# Patient Record
Sex: Male | Born: 1955 | Race: White | Hispanic: No | Marital: Married | Smoking: Never smoker
Health system: Southern US, Community
[De-identification: ages and names within clinical notes are randomized; demographics above are authoritative.]

## PROBLEM LIST (undated history)

## (undated) DIAGNOSIS — M109 Gout, unspecified: Secondary | ICD-10-CM

## (undated) DIAGNOSIS — H109 Unspecified conjunctivitis: Secondary | ICD-10-CM

## (undated) DIAGNOSIS — L309 Dermatitis, unspecified: Secondary | ICD-10-CM

## (undated) DIAGNOSIS — R55 Syncope and collapse: Secondary | ICD-10-CM

## (undated) DIAGNOSIS — M2041 Other hammer toe(s) (acquired), right foot: Secondary | ICD-10-CM

## (undated) DIAGNOSIS — R9439 Abnormal result of other cardiovascular function study: Secondary | ICD-10-CM

## (undated) DIAGNOSIS — M129 Arthropathy, unspecified: Secondary | ICD-10-CM

## (undated) DIAGNOSIS — G459 Transient cerebral ischemic attack, unspecified: Secondary | ICD-10-CM

## (undated) DIAGNOSIS — F419 Anxiety disorder, unspecified: Secondary | ICD-10-CM

## (undated) DIAGNOSIS — Z952 Presence of prosthetic heart valve: Secondary | ICD-10-CM

## (undated) DIAGNOSIS — R7989 Other specified abnormal findings of blood chemistry: Secondary | ICD-10-CM

## (undated) DIAGNOSIS — L0291 Cutaneous abscess, unspecified: Secondary | ICD-10-CM

## (undated) DIAGNOSIS — I517 Cardiomegaly: Secondary | ICD-10-CM

## (undated) DIAGNOSIS — L509 Urticaria, unspecified: Secondary | ICD-10-CM

## (undated) DIAGNOSIS — S46019A Strain of muscle(s) and tendon(s) of the rotator cuff of unspecified shoulder, initial encounter: Secondary | ICD-10-CM

## (undated) DIAGNOSIS — E669 Obesity, unspecified: Secondary | ICD-10-CM

## (undated) DIAGNOSIS — M719 Bursopathy, unspecified: Secondary | ICD-10-CM

## (undated) DIAGNOSIS — J029 Acute pharyngitis, unspecified: Secondary | ICD-10-CM

## (undated) DIAGNOSIS — G4700 Insomnia, unspecified: Secondary | ICD-10-CM

## (undated) DIAGNOSIS — M7750 Other enthesopathy of unspecified foot: Secondary | ICD-10-CM

## (undated) DIAGNOSIS — I10 Essential (primary) hypertension: Secondary | ICD-10-CM

## (undated) DIAGNOSIS — M25579 Pain in unspecified ankle and joints of unspecified foot: Secondary | ICD-10-CM

## (undated) DIAGNOSIS — I951 Orthostatic hypotension: Secondary | ICD-10-CM

## (undated) DIAGNOSIS — K224 Dyskinesia of esophagus: Secondary | ICD-10-CM

## (undated) DIAGNOSIS — I350 Nonrheumatic aortic (valve) stenosis: Secondary | ICD-10-CM

## (undated) DIAGNOSIS — L0390 Cellulitis, unspecified: Secondary | ICD-10-CM

## (undated) DIAGNOSIS — S8010XA Contusion of unspecified lower leg, initial encounter: Secondary | ICD-10-CM

## (undated) DIAGNOSIS — M5412 Radiculopathy, cervical region: Secondary | ICD-10-CM

## (undated) DIAGNOSIS — I4891 Unspecified atrial fibrillation: Secondary | ICD-10-CM

## (undated) DIAGNOSIS — E785 Hyperlipidemia, unspecified: Secondary | ICD-10-CM

## (undated) DIAGNOSIS — R609 Edema, unspecified: Secondary | ICD-10-CM

## (undated) DIAGNOSIS — E7800 Pure hypercholesterolemia, unspecified: Secondary | ICD-10-CM

## (undated) DIAGNOSIS — J069 Acute upper respiratory infection, unspecified: Secondary | ICD-10-CM

## (undated) DIAGNOSIS — T7840XA Allergy, unspecified, initial encounter: Secondary | ICD-10-CM

## (undated) HISTORY — DX: Gout, unspecified: M10.9

## (undated) HISTORY — DX: Strain of muscle(s) and tendon(s) of the rotator cuff of unspecified shoulder, initial encounter: S46.019A

## (undated) HISTORY — DX: Radiculopathy, cervical region: M54.12

## (undated) HISTORY — DX: Unspecified atrial fibrillation: I48.91

## (undated) HISTORY — DX: Nonrheumatic aortic (valve) stenosis: I35.0

## (undated) HISTORY — DX: Anxiety disorder, unspecified: F41.9

## (undated) HISTORY — DX: Other hammer toe(s) (acquired), right foot: M20.41

## (undated) HISTORY — DX: Urticaria, unspecified: L50.9

## (undated) HISTORY — DX: Acute upper respiratory infection, unspecified: J06.9

## (undated) HISTORY — DX: Cutaneous abscess, unspecified: L02.91

## (undated) HISTORY — DX: Dyskinesia of esophagus: K22.4

## (undated) HISTORY — DX: Acute pharyngitis, unspecified: J02.9

## (undated) HISTORY — DX: Essential (primary) hypertension: I10

## (undated) HISTORY — DX: Unspecified conjunctivitis: H10.9

## (undated) HISTORY — DX: Transient cerebral ischemic attack, unspecified: G45.9

## (undated) HISTORY — DX: Other specified abnormal findings of blood chemistry: R79.89

## (undated) HISTORY — DX: Insomnia, unspecified: G47.00

## (undated) HISTORY — PX: UVULECTOMY: SHX2631

## (undated) HISTORY — PX: TONSILLECTOMY: SUR1361

## (undated) HISTORY — DX: Abnormal result of other cardiovascular function study: R94.39

## (undated) HISTORY — DX: Arthropathy, unspecified: M12.9

## (undated) HISTORY — DX: Allergy, unspecified, initial encounter: T78.40XA

## (undated) HISTORY — DX: Obesity, unspecified: E66.9

## (undated) HISTORY — DX: Cellulitis, unspecified: L03.90

## (undated) HISTORY — DX: Bursopathy, unspecified: M71.9

## (undated) HISTORY — DX: Hyperlipidemia, unspecified: E78.5

## (undated) HISTORY — DX: Orthostatic hypotension: I95.1

## (undated) HISTORY — DX: Dermatitis, unspecified: L30.9

## (undated) HISTORY — DX: Pain in unspecified ankle and joints of unspecified foot: M25.579

## (undated) HISTORY — DX: Pure hypercholesterolemia, unspecified: E78.00

## (undated) HISTORY — DX: Presence of prosthetic heart valve: Z95.2

## (undated) HISTORY — DX: Contusion of unspecified lower leg, initial encounter: S80.10XA

## (undated) HISTORY — DX: Edema, unspecified: R60.9

## (undated) HISTORY — DX: Cardiomegaly: I51.7

## (undated) HISTORY — DX: Syncope and collapse: R55

## (undated) HISTORY — DX: Other enthesopathy of unspecified foot and ankle: M77.50

---

## 1994-06-24 HISTORY — PX: NASAL SEPTUM SURGERY: SHX37

## 2012-12-22 DIAGNOSIS — Z8673 Personal history of transient ischemic attack (TIA), and cerebral infarction without residual deficits: Secondary | ICD-10-CM

## 2012-12-22 HISTORY — DX: Personal history of transient ischemic attack (TIA), and cerebral infarction without residual deficits: Z86.73

## 2013-01-14 DIAGNOSIS — G459 Transient cerebral ischemic attack, unspecified: Secondary | ICD-10-CM | POA: Insufficient documentation

## 2013-06-24 HISTORY — PX: HAMMER TOE SURGERY: SHX385

## 2013-06-24 HISTORY — PX: METATARSAL OSTEOTOMY: SHX1641

## 2013-08-31 DIAGNOSIS — M2040 Other hammer toe(s) (acquired), unspecified foot: Secondary | ICD-10-CM | POA: Insufficient documentation

## 2014-06-24 HISTORY — PX: AORTIC VALVE REPLACEMENT: SHX41

## 2015-02-13 HISTORY — PX: RIGHT AND LEFT HEART CATH: CATH118262

## 2015-06-20 HISTORY — PX: CARDIOVERSION: SHX1299

## 2016-12-18 DIAGNOSIS — M1991 Primary osteoarthritis, unspecified site: Secondary | ICD-10-CM | POA: Insufficient documentation

## 2018-06-24 HISTORY — PX: MEDIAL PARTIAL KNEE REPLACEMENT: SHX5965

## 2019-05-05 LAB — HM COLONOSCOPY

## 2020-07-05 ENCOUNTER — Telehealth: Payer: Self-pay

## 2020-07-05 NOTE — Telephone Encounter (Signed)
[facility] ON FILE [facility] [phone] SENT REFERRAL TO SCHEDULING

## 2020-07-20 NOTE — Progress Notes (Signed)
CARDIOLOGY CONSULT NOTE       Patient ID: Cameron Carey MRN: [id number] DOB/AGE: 1955-11-21 65 y.o.  Admit date: (Not on file) Referring Physician: Carole Civil DO Primary Physician: Patient, No Pcp Per Primary Cardiologist: New Reason for Consultation: PAF   Active Problems:   * No active hospital problems. *   HPI:  65 y.o. 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  Office visit 06/13/20 noted to be in asymptomatic afib. Started on eliquis 5 bid ECG reviewed from 06/13/20 afib rate 97 voltage for LVH otherwise normal   He had a bioprosthetic AVR [place] in [place] 2018 No CAD. Had post op afib with 3 cardioversions Was not on anticoagulation For more than a couple months   He drinks beer/bourbon 1/weeknight and 3-4 on weekend nights. Discussed cutting back  He has noted since December and onset of afib that he has more LE edema and exertional dyspnea. 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 Abscess   . Allergy   . Anxiety   . Aortic valve stenosis   . Arthropathy   . Atrial fibrillation (HCC)   . Brachial radiculitis   . Bursitis   . Cellulitis   . Conjunctivitis   . Contusion of lower leg   . Dermatitis   . Edema   . Enthesopathy of ankle   . Esophageal dysmotility   . Gout   . History of heart valve  replacement   . HLD (hyperlipidemia)   . HTN (hypertension)   . Hypercholesterolemia   . Insomnia   . Joint pain of ankle and foot, unspecified laterality   . Left ventricular hypertrophy   . Obesity (BMI 30-39.9)   . Orthostatic hypotension   . Pharyngitis   . Rotator cuff strain    inflammation  . Serum creatinine raised   . Syncope and collapse   . Transient cerebral ischemia   . Upper respiratory infection   . Wheal     History reviewed. No pertinent family history.  Social History   Socioeconomic History  . Marital status: Married    Spouse name: Not on file  . Number of children: Not on file  . Years of education: Not on file  . Highest education level: Not on file  Occupational History  . Not on file  Tobacco Use  . Smoking status: Never Smoker  . Smokeless tobacco: Never Used  Substance and Sexual Activity  . Alcohol use: Never  . Drug use: Never  . Sexual activity: Not on file  Other Topics Concern  . Not on file  Social History Narrative  . Not on file   Social Determinants of Health   Financial Resource Strain: Not on file  Food Insecurity: Not  on file  Transportation Needs: Not on file  Physical Activity: Not on file  Stress: Not on file  Social Connections: Not on file  Intimate Partner Violence: Not on file    Past Surgical History:  Procedure Laterality Date  . NO PAST SURGERIES        Current Outpatient Medications:  .  allopurinol (ZYLOPRIM) 100 MG tablet, Take 100 mg by mouth daily., Disp: , Rfl:  .  amLODipine (NORVASC) 5 MG tablet, Take 5 mg by mouth daily., Disp: , Rfl:  .  aspirin 81 MG EC tablet, Take 162 mg by mouth daily., Disp: , Rfl:  .  atorvastatin (LIPITOR) 20 MG tablet, Take 20 mg by mouth daily., Disp: , Rfl:  .  clonazePAM (KLONOPIN) 0.5 MG tablet, Take 0.5 mg by mouth 2 (two) times daily as needed for anxiety., Disp: , Rfl:  .  fluocinolone (VANOS) 0.01 % cream, Apply topically 2 (two) times daily., Disp: , Rfl:  .   hydrochlorothiazide (HYDRODIURIL) 25 MG tablet, Take 25 mg by mouth daily., Disp: , Rfl:  .  HYDROXYZINE HCL PO, Take 25 mg by mouth., Disp: , Rfl:  .  metoprolol succinate (TOPROL-XL) 100 MG 24 hr tablet, Take 75 mg by mouth daily., Disp: , Rfl:  .  olmesartan (BENICAR) 5 MG tablet, Take by mouth daily., Disp: , Rfl:  .  tadalafil (CIALIS) 20 MG tablet, Take 20 mg by mouth daily as needed for erectile dysfunction., Disp: , Rfl:     Physical Exam: Blood pressure 126/86, pulse 72, height 6\' 2"  (1.88 m), weight 127.5 kg, SpO2 97 %.   Affect appropriate Healthy:  appears stated age HEENT: normal Neck supple with no adenopathy JVP normal no bruits no thyromegaly Lungs clear with no wheezing and good diaphragmatic motion Heart:  S1/S2 no murmur, no rub, gallop or click PMI normal Abdomen: benighn, BS positve, no tenderness, no AAA no bruit.  No HSM or HJR Distal pulses intact with no bruits No edema Neuro non-focal Skin warm and dry No muscular weakness   Labs:  No results found for: WBC, HGB, HCT, MCV, PLT No results for input(s): NA, K, CL, CO2, BUN, CREATININE, CALCIUM, PROT, BILITOT, ALKPHOS, ALT, AST, GLUCOSE in the last 168 hours.  Invalid input(s): LABALBU No results found for: CKTOTAL, CKMB, CKMBINDEX, TROPONINI No results found for: CHOL No results found for: HDL No results found for: LDLCALC No results found for: TRIG No results found for: CHOLHDL No results found for: LDLDIRECT    Radiology: No results found.  EKG: afib rate 68 bpm normal ST segments    ASSESSMENT AND PLAN:   1. PAF:  Start xarelto  F/U TTE assess EF and atrial sizes. Continue beta blocker rates are fine Risk of DCC including stroke , TMP intubation discussed wants to proceed once on anticoagulation for 3 weeks  2. HTN:  Stop norvasc likely contributing to edema  3. HLD:  At goal continue statin 4. Insomnia: continue PRN xanax  5. AVR:  No AR on exam 2018 bioprosthetic will try to get op note  from [place] see above regarding TTE   F/u in 3-4 weeks to arrange [facility] TTE D/c Norvasc D/c ASA Start Xarelto   Signed: [facility] 07/25/2020, 4:31 PM

## 2020-07-25 ENCOUNTER — Encounter: Payer: Self-pay | Admitting: Cardiovascular Disease

## 2020-07-25 ENCOUNTER — Ambulatory Visit: Payer: 59 | Admitting: Cardiovascular Disease

## 2020-07-25 ENCOUNTER — Other Ambulatory Visit: Payer: Self-pay

## 2020-07-25 VITALS — BP 126/86 | HR 72 | Ht 74.0 in | Wt 281.0 lb

## 2020-07-25 DIAGNOSIS — I10 Essential (primary) hypertension: Secondary | ICD-10-CM

## 2020-07-25 DIAGNOSIS — I480 Paroxysmal atrial fibrillation: Secondary | ICD-10-CM

## 2020-07-25 DIAGNOSIS — Z7901 Long term (current) use of anticoagulants: Secondary | ICD-10-CM

## 2020-07-25 MED ORDER — RIVAROXABAN 20 MG PO TABS: 20.0000 mg | ORAL_TABLET | Freq: Every day | ORAL | 11 refills | Status: DC

## 2020-07-25 NOTE — Patient Instructions (Signed)
Medication Instructions:  Your physician has recommended you make the following change in your medication:  1-STOP Aspirin 2-STOP Amlodipine 3-START Xarelto 20 mg by mouth daily with largest meal of the day.  *If you need a refill on your cardiac medications before your next appointment, please call your pharmacy*   Lab Work: If you have labs (blood work) drawn today and your tests are completely normal, you will receive your results only by: Marland Kitchen MyChart Message (if you have MyChart) OR . A paper copy in the mail If you have any lab test that is abnormal or we need to change your treatment, we will call you to review the results.   Testing/Procedures: Your physician has requested that you have an echocardiogram. Echocardiography is a painless test that uses sound waves to create images of your heart. It provides your doctor with information about the size and shape of your heart and how well your heart's chambers and valves are working. This procedure takes approximately one hour. There are no restrictions for this procedure.  Follow-Up: At [facility] [place], you and your health needs are our priority.  As part of our continuing mission to provide you with exceptional heart care, we have created designated Provider Care Teams.  These Care Teams include your primary Cardiologist (physician) and Advanced Practice Providers (APPs -  Physician Assistants and Nurse Practitioners) who all work together to provide you with the care you need, when you need it.  We recommend signing up for the patient portal called "MyChart".  Sign up information is provided on this After Visit Summary.  MyChart is used to connect with patients for Virtual Visits (Telemedicine).  Patients are able to view lab/test results, encounter notes, upcoming appointments, etc.  Non-urgent messages can be sent to your provider as well.   To learn more about what you can do with MyChart, go to [email or web address].    Your next  appointment:   3 to 4 weeks  The format for your next appointment:   In Person  Provider:   You may see Dr. Eden Emms or one of the following Advanced Practice Providers on your designated Care Team:    Georgie Chard, NP

## 2020-08-03 ENCOUNTER — Telehealth: Payer: Self-pay

## 2020-08-03 DIAGNOSIS — Z952 Presence of prosthetic heart valve: Secondary | ICD-10-CM | POA: Insufficient documentation

## 2020-08-03 NOTE — Telephone Encounter (Signed)
Yes, please arrange

## 2020-08-03 NOTE — Telephone Encounter (Signed)
Please call Pt to schedule NP appt at his convenience please. Thank you.

## 2020-08-03 NOTE — Telephone Encounter (Signed)
Received message from Dr. Eden Emms- Pt is needing a PCP- would you be willing to accept him?

## 2020-08-07 NOTE — Telephone Encounter (Signed)
NP appt scheduled 08/18/20.

## 2020-08-10 DIAGNOSIS — E785 Hyperlipidemia, unspecified: Secondary | ICD-10-CM | POA: Insufficient documentation

## 2020-08-10 DIAGNOSIS — R9439 Abnormal result of other cardiovascular function study: Secondary | ICD-10-CM | POA: Insufficient documentation

## 2020-08-10 DIAGNOSIS — M109 Gout, unspecified: Secondary | ICD-10-CM | POA: Insufficient documentation

## 2020-08-10 DIAGNOSIS — Z9889 Other specified postprocedural states: Secondary | ICD-10-CM | POA: Insufficient documentation

## 2020-08-10 DIAGNOSIS — R42 Dizziness and giddiness: Secondary | ICD-10-CM | POA: Insufficient documentation

## 2020-08-10 DIAGNOSIS — I350 Nonrheumatic aortic (valve) stenosis: Secondary | ICD-10-CM | POA: Insufficient documentation

## 2020-08-10 DIAGNOSIS — I672 Cerebral atherosclerosis: Secondary | ICD-10-CM | POA: Insufficient documentation

## 2020-08-10 DIAGNOSIS — I2729 Other secondary pulmonary hypertension: Secondary | ICD-10-CM | POA: Insufficient documentation

## 2020-08-10 DIAGNOSIS — I259 Chronic ischemic heart disease, unspecified: Secondary | ICD-10-CM | POA: Insufficient documentation

## 2020-08-10 DIAGNOSIS — I4891 Unspecified atrial fibrillation: Secondary | ICD-10-CM | POA: Insufficient documentation

## 2020-08-17 ENCOUNTER — Ambulatory Visit (HOSPITAL_COMMUNITY): Payer: 59 | Attending: Cardiology

## 2020-08-17 ENCOUNTER — Other Ambulatory Visit: Payer: Self-pay

## 2020-08-17 DIAGNOSIS — I480 Paroxysmal atrial fibrillation: Secondary | ICD-10-CM | POA: Diagnosis not present

## 2020-08-17 LAB — ECHOCARDIOGRAM COMPLETE
AR max vel: 0.8 cm2
AV Area VTI: 0.84 cm2
AV Area mean vel: 0.85 cm2
AV Mean grad: 19 mmHg
AV Peak grad: 33.6 mmHg
Ao pk vel: 2.9 m/s
Area-P 1/2: 3.52 cm2
S' Lateral: 3.4 cm

## 2020-08-17 MED ORDER — PERFLUTREN LIPID MICROSPHERE
1.0000 mL | INTRAVENOUS | Status: AC | PRN
  Administered 2020-08-17: 1 mL via INTRAVENOUS

## 2020-08-17 NOTE — Progress Notes (Deleted)
CARDIOLOGY CONSULT NOTE       Patient ID: Cameron Carey MRN: [id number] DOB/AGE: August 10, 1955 65 y.o.  Referring Physician: Carole Civil DO Primary Physician: Patient, No Pcp Per Primary Cardiologist: Eden Emms Reason for Consultation: PAF    HPI:  65 y.o. 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  Office visit 06/13/20 noted to be in asymptomatic afib. Started on eliquis 5 bid ECG reviewed from 06/13/20 afib rate 97 voltage for LVH otherwise normal   He had a bioprosthetic AVR [place] in [place] 2018 No CAD. Had post op afib with 3 cardioversions Was not on anticoagulation For more than a couple months   He drinks beer/bourbon 1/weeknight and 3-4 on weekend nights. Discussed cutting back  He has noted since December and onset of afib that he has more LE edema and exertional dyspnea. 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 Abnormal stress echo   . Allergy   . Anxiety   . Aortic valve stenosis   . Arthropathy   . Atrial fibrillation (HCC)   . Brachial radiculitis   . Edema   . Enthesopathy of ankle   . Esophageal dysmotility   . Gout   . History of heart valve replacement   . HTN (hypertension)   . Hypercholesterolemia   . Hypertension   . Insomnia   . Joint pain of ankle and foot, unspecified laterality   . Left ventricular hypertrophy   .  Obesity (BMI 30-39.9)   . Orthostatic hypotension   . Syncope and collapse   . Transient cerebral ischemia     Family History  Problem Relation Age of Onset  . Heart murmur Father     Social History   Socioeconomic History  . Marital status: Married    Spouse name: Not on file  . Number of children: Not on file  . Years of education: Not on file  . Highest education level: Not on file  Occupational History  . Not on file  Tobacco Use  . Smoking status: Never Smoker  . Smokeless tobacco: Never Used  Substance and Sexual Activity  . Alcohol use: Never  . Drug use: Never  . Sexual activity: Not on file  Other Topics Concern  . Not on file  Social History Narrative  . Not on file   Social Determinants of Health   Financial Resource Strain: Not on file  Food Insecurity: Not on file  Transportation Needs: Not on file  Physical Activity: Not on file  Stress: Not on file  Social Connections: Not on file  Intimate Partner Violence: Not on file    Past Surgical History:  Procedure Laterality Date  . AORTIC VALVE REPLACEMENT  2016  . CARDIOVERSION  06/20/2015  . RIGHT AND LEFT HEART CATH  02/13/2015  .  TONSILLECTOMY    . UVULECTOMY        Current Outpatient Medications:  .  allopurinol (ZYLOPRIM) 100 MG tablet, Take 100 mg by mouth daily., Disp: , Rfl:  .  atorvastatin (LIPITOR) 20 MG tablet, Take 20 mg by mouth daily., Disp: , Rfl:  .  clonazePAM (KLONOPIN) 0.5 MG tablet, Take 0.5 mg by mouth 2 (two) times daily as needed for anxiety., Disp: , Rfl:  .  fluocinolone (VANOS) 0.01 % cream, Apply topically 2 (two) times daily., Disp: , Rfl:  .  hydrochlorothiazide (HYDRODIURIL) 25 MG tablet, Take 25 mg by mouth daily., Disp: , Rfl:  .  HYDROXYZINE HCL PO, Take 25 mg by mouth., Disp: , Rfl:  .  metoprolol succinate (TOPROL-XL) 100 MG 24 hr tablet, Take 75 mg by mouth daily., Disp: , Rfl:  .  olmesartan (BENICAR) 5 MG tablet, Take by mouth daily., Disp: , Rfl:  .   rivaroxaban (XARELTO) 20 MG TABS tablet, Take 1 tablet (20 mg total) by mouth daily with supper., Disp: 30 tablet, Rfl: 11 .  tadalafil (CIALIS) 20 MG tablet, Take 20 mg by mouth daily as needed for erectile dysfunction., Disp: , Rfl:     Physical Exam: There were no vitals taken for this visit.   Affect appropriate Healthy:  appears stated age HEENT: normal Neck supple with no adenopathy JVP normal no bruits no thyromegaly Lungs clear with no wheezing and good diaphragmatic motion Heart:  S1/S2 no murmur, no rub, gallop or click PMI normal Abdomen: benighn, BS positve, no tenderness, no AAA no bruit.  No HSM or HJR Distal pulses intact with no bruits No edema Neuro non-focal Skin warm and dry No muscular weakness   Labs:  No results found for: WBC, HGB, HCT, MCV, PLT No results for input(s): NA, K, CL, CO2, BUN, CREATININE, CALCIUM, PROT, BILITOT, ALKPHOS, ALT, AST, GLUCOSE in the last 168 hours.  Invalid input(s): LABALBU No results found for: CKTOTAL, CKMB, CKMBINDEX, TROPONINI No results found for: CHOL No results found for: HDL No results found for: LDLCALC No results found for: TRIG No results found for: CHOLHDL No results found for: LDLDIRECT    Radiology: No results found.  EKG: afib rate 68 bpm normal ST segments    ASSESSMENT AND PLAN:   1. PAF:  Start xarelto  07/25/20 No missed doses F/U  Continue beta blocker rates are fine Risk of DCC including stroke , TMP intubation discussed wants to proceed *** 2. HTN:  On diuretic , ARB and Toprol *** 3. HLD:  At goal continue statin 4. Insomnia: continue PRN xanax  5. AVR:    Echo reviewed from 08/17/20 ***  Arranged for Dr Drue Novel to see him as new primary   ***  Signed: Charlton Haws 08/17/2020, 1:47 PM

## 2020-08-18 ENCOUNTER — Ambulatory Visit: Payer: 59 | Admitting: Internal Medicine

## 2020-08-21 ENCOUNTER — Telehealth: Payer: Self-pay

## 2020-08-21 NOTE — Telephone Encounter (Signed)
08/21/2020 1:17 PM EST Back to Top     The patient has been notified of the result and verbalized understanding. All questions (if any) were answered. Cindi Carbon Chemult, RN 08/21/2020 1:15 PM   Patient had surgery at [facility] in [place], [place]. Will send message to Medical Records to see if record can be received.

## 2020-08-21 NOTE — Telephone Encounter (Signed)
-----   Message from Wendall Stade, MD sent at 08/18/2020  7:18 AM EST ----- EF normal AVR with a bit high gradient no baseline  See if we can get operative report on valve so we  Know what size it is

## 2020-08-22 NOTE — Telephone Encounter (Signed)
Faxed request for operative report this morning

## 2020-08-23 ENCOUNTER — Ambulatory Visit: Payer: 59 | Admitting: Cardiovascular Disease

## 2020-08-25 ENCOUNTER — Ambulatory Visit: Payer: 59 | Admitting: Internal Medicine

## 2020-08-25 ENCOUNTER — Other Ambulatory Visit: Payer: Self-pay

## 2020-08-25 VITALS — BP 114/74 | HR 71 | Temp 97.7°F | Ht 74.0 in | Wt 271.0 lb

## 2020-08-25 DIAGNOSIS — I10 Essential (primary) hypertension: Secondary | ICD-10-CM | POA: Diagnosis not present

## 2020-08-25 DIAGNOSIS — E785 Hyperlipidemia, unspecified: Secondary | ICD-10-CM

## 2020-08-25 DIAGNOSIS — I480 Paroxysmal atrial fibrillation: Secondary | ICD-10-CM | POA: Diagnosis not present

## 2020-08-25 DIAGNOSIS — I2729 Other secondary pulmonary hypertension: Secondary | ICD-10-CM

## 2020-08-25 MED ORDER — ZOLPIDEM TARTRATE 10 MG PO TABS: 5.0000 mg | ORAL_TABLET | Freq: Every evening | ORAL | 0 refills | Status: DC | PRN

## 2020-08-25 NOTE — Progress Notes (Addendum)
Subjective:    Patient ID: Cameron Carey, male    DOB: [birth year], 65 y.o.   MRN: [id number]  DOS:  08/25/2020 Type of visit - description: New patient. Here to get established, in general feels well. Back in December he felt some palpitations of DOE, went to see his primary doctor at the time and was found to be in A. fib He moved to this area recently and got established with Dr. Eden Emms on 07/25/2020. Note reviewed.  Amlodipine was discontinued due to edema however patient restarted on 08/17/2020 due to elevated BP. Edema is not severe at this point and is better with leg elevation.  Overall  he is doing well. Denies nausea, vomiting, diarrhea.  No blood in the stools. No cough Mild DOE from time to time still there.    Review of Systems See above   Past Medical History:  Diagnosis Date  . Abnormal stress echo   . Allergy   . Anxiety   . Aortic valve stenosis   . Arthropathy   . Atrial fibrillation (HCC)   . Brachial radiculitis   . Edema   . Enthesopathy of ankle   . Esophageal dysmotility   . Gout   . History of heart valve replacement   . HTN (hypertension)   . Hypercholesterolemia   . Insomnia   . Left ventricular hypertrophy   . Obesity (BMI 30-39.9)   . Orthostatic hypotension   . Syncope and collapse     Past Surgical History:  Procedure Laterality Date  . AORTIC VALVE REPLACEMENT  2016  . CARDIOVERSION  06/20/2015  . MEDIAL PARTIAL KNEE REPLACEMENT Bilateral 2020   2 procedures done 1 month apart   . RIGHT AND LEFT HEART CATH  02/13/2015  . TONSILLECTOMY    . UVULECTOMY     Social History   Socioeconomic History  . Marital status: Married    Spouse name: Not on file  . Number of children: 3  . Years of education: Not on file  . Highest education level: Not on file  Occupational History  . Occupation: HR director   Tobacco Use  . Smoking status: Never Smoker  . Smokeless tobacco: Never Used  Vaping Use  . Vaping Use: Never used  Substance  and Sexual Activity  . Alcohol use: Yes    Comment: ~ 3 times a week  . Drug use: Never  . Sexual activity: Not on file  Other Topics Concern  . Not on file  Social History Narrative   Moved to [facility] 06/2020    Social Determinants of Health   Financial Resource Strain: Not on file  Food Insecurity: Not on file  Transportation Needs: Not on file  Physical Activity: Not on file  Stress: Not on file  Social Connections: Not on file  Intimate Partner Violence: Not on file   Family History  Problem Relation Age of Onset  . Heart murmur Father   . Colon cancer Neg Hx   . Prostate cancer Neg Hx   . Diabetes Neg Hx      Allergies as of 08/25/2020      Reactions   Acetazolamide    Other reaction(s): Passed out   Bactrim [sulfamethoxazole-trimethoprim]    Statins Other (See Comments)   Other reaction(s): Unspecified      Medication List       Accurate as of August 25, 2020 11:59 PM. If you have any questions, ask your nurse or doctor.  STOP taking these medications   clonazePAM 0.5 MG tablet Commonly known as: KLONOPIN Stopped by: Willow Ora, MD   hydrochlorothiazide 25 MG tablet Commonly known as: HYDRODIURIL Stopped by: Willow Ora, MD   olmesartan 5 MG tablet Commonly known as: BENICAR Stopped by: Willow Ora, MD     TAKE these medications   allopurinol 100 MG tablet Commonly known as: ZYLOPRIM Take 2 tablets (200 mg total) by mouth daily. What changed: how much to take Changed by: Willow Ora, MD   amLODipine 5 MG tablet Commonly known as: NORVASC Take 1 tablet (5 mg total) by mouth daily.   atorvastatin 20 MG tablet Commonly known as: LIPITOR Take 20 mg by mouth daily.   fluocinolone 0.01 % cream Commonly known as: VANOS Apply topically 2 (two) times daily.   HYDROXYZINE HCL PO Take 25 mg by mouth.   metoprolol succinate 100 MG 24 hr tablet Commonly known as: TOPROL-XL Take 75 mg by mouth daily.   Olmesartan-amLODIPine-HCTZ 40-5-25 MG Tabs    rivaroxaban 20 MG Tabs tablet Commonly known as: XARELTO Take 1 tablet (20 mg total) by mouth daily with supper.   tadalafil 20 MG tablet Commonly known as: CIALIS Take 20 mg by mouth daily as needed for erectile dysfunction.   zolpidem 10 MG tablet Commonly known as: AMBIEN Take 0.5-1 tablets (5-10 mg total) by mouth at bedtime as needed for sleep. Started by: Willow Ora, MD          Objective:   Physical Exam BP 114/74 (BP Location: Left Arm, Patient Position: Sitting, Cuff Size: Large)   Pulse 71   Temp 97.7 F (36.5 C) (Oral)   Ht 6\' 2"  (1.88 m)   Wt 271 lb (122.9 kg)   SpO2 95%   BMI 34.79 kg/m  General:   Well developed, NAD, BMI noted.  HEENT:  Normocephalic . Face symmetric, atraumatic Neck: No thyromegaly Lungs:  CTA B Normal respiratory effort, no intercostal retractions, no accessory muscle use. Heart: Regular?  Question of soft systolic murmur Abdomen:  Not distended, soft, non-tender. No rebound or rigidity.   Skin: Not pale. Not jaundice Lower extremities: Trace pretibial edema bilaterally  Neurologic:  alert & oriented X3.  Speech normal, gait appropriate for age and unassisted Psych--  Cognition and judgment appear intact.  Cooperative with normal attention span and concentration.  Behavior appropriate. No anxious or depressed appearing.     Assessment      Assessment (new patient 08/2020, referred by Dr. 09/2020) HTN High cholesterol CV: --AVR, bioprosthetic, 2018 in 2019. --Postop A. Fib >>> on  A. Fib again ~ 05-2020 --No CAD Gout  OSA : better after uvulectomy  (~ 2000s)   Plan: Recently moved to the area, referred by Dr.Nishan HTN: Currently taking a combination of olmesartan-amlodipine-HCTZ: 40-5/25 as well as metoprolol. Amlodipine was d/c by cards on  07/25/20 d/t edema (I do not think they knew he was taking  amlodipine-HCTZ in combo w/ olmesartan) however patient self restarted amlodipine 08/17/2020 at the time of an  echocardiogram because BP was elevated. Edema is not much worse since then, trace edema today, reportedly gets better with leg elevation. I made the patient noticed that he takes a total of 10 mg of amlodipine. Plan: Low-salt diet, monitor BPs, CMP, CBC. Hyperlipidemia: On atorvastatin, check FLP, A1c. Atrial fibrillation: Patient had transient postop A. fib in 2018; his previous PCP noted A. fib again 05/2020, currently anticoagulated, on metoprolol and with minimal symptoms. We will check a TSH.  Vaccinations: Had COVID vax x3, Shingrix x2 and a flu shot RTC for labs next week RTC office visit 3 months     This visit occurred during the SARS-CoV-2 public health emergency.  Safety protocols were in place, including screening questions prior to the visit, additional usage of staff PPE, and extensive cleaning of exam room while observing appropriate contact time as indicated for disinfecting solutions.

## 2020-08-25 NOTE — Patient Instructions (Addendum)
Stop clonazepam  Start zolpidem to help you sleep.  You can take either half or 1 tablet at bedtime as needed.    Check the  blood pressure twice weekly BP GOAL is between 110/65 and  135/85. If it is consistently higher or lower, let me know    GO TO THE FRONT DESK, PLEASE SCHEDULE YOUR APPOINTMENTS Come back for fasting blood work next week, please make an appointment  Come back for a checkup with me in 3 months

## 2020-08-26 DIAGNOSIS — Z09 Encounter for follow-up examination after completed treatment for conditions other than malignant neoplasm: Secondary | ICD-10-CM | POA: Insufficient documentation

## 2020-08-26 DIAGNOSIS — I10 Essential (primary) hypertension: Secondary | ICD-10-CM | POA: Insufficient documentation

## 2020-08-26 NOTE — Assessment & Plan Note (Addendum)
Recently moved to the area, referred by Dr.Nishan HTN: Currently taking a combination of olmesartan-amlodipine-HCTZ: 40-5/25 as well as metoprolol. Amlodipine was d/c by cards on  07/25/20 d/t edema (I do not think they knew he was taking  amlodipine-HCTZ in combo w/ olmesartan) however patient self restarted amlodipine 08/17/2020 at the time of an echocardiogram because BP was elevated. Edema is not much worse since then, trace edema today, reportedly gets better with leg elevation. I made the patient noticed that he takes a total of 10 mg of amlodipine. Plan: Low-salt diet, monitor BPs, CMP, CBC. Hyperlipidemia: On atorvastatin, check FLP, A1c. Atrial fibrillation: Patient had transient postop A. fib in 2018; his previous PCP noted A. fib again 05/2020, currently anticoagulated, on metoprolol and with minimal symptoms. We will check a TSH. Vaccinations: Had COVID vax x3, Shingrix x2 and a flu shot RTC for labs next week RTC office visit 3 months

## 2020-08-28 ENCOUNTER — Other Ambulatory Visit (INDEPENDENT_AMBULATORY_CARE_PROVIDER_SITE_OTHER): Payer: 59

## 2020-08-28 ENCOUNTER — Other Ambulatory Visit: Payer: Self-pay

## 2020-08-28 DIAGNOSIS — I2729 Other secondary pulmonary hypertension: Secondary | ICD-10-CM

## 2020-08-28 DIAGNOSIS — I480 Paroxysmal atrial fibrillation: Secondary | ICD-10-CM | POA: Diagnosis not present

## 2020-08-28 DIAGNOSIS — E785 Hyperlipidemia, unspecified: Secondary | ICD-10-CM | POA: Diagnosis not present

## 2020-08-28 LAB — COMPREHENSIVE METABOLIC PANEL
ALT: 21 U/L (ref 0–53)
AST: 19 U/L (ref 0–37)
Albumin: 4.2 g/dL (ref 3.5–5.2)
Alkaline Phosphatase: 76 U/L (ref 39–117)
BUN: 38 mg/dL — ABNORMAL HIGH (ref 6–23)
CO2: 28 mEq/L (ref 19–32)
Calcium: 9.7 mg/dL (ref 8.4–10.5)
Chloride: 103 mEq/L (ref 96–112)
Creatinine, Ser: 1.29 mg/dL (ref 0.40–1.50)
GFR: 58.59 mL/min — ABNORMAL LOW (ref >60.00)
Glucose, Bld: 111 mg/dL — ABNORMAL HIGH (ref 70–99)
Potassium: 3.5 mEq/L (ref 3.5–5.1)
Sodium: 141 mEq/L (ref 135–145)
Total Bilirubin: 0.9 mg/dL (ref 0.2–1.2)
Total Protein: 7 g/dL (ref 6.0–8.3)

## 2020-08-28 LAB — CBC WITH DIFFERENTIAL/PLATELET
Basophils Absolute: 0 10*3/uL (ref 0.0–0.1)
Basophils Relative: 0.7 % (ref 0.0–3.0)
Eosinophils Absolute: 0.2 10*3/uL (ref 0.0–0.7)
Eosinophils Relative: 4.1 % (ref 0.0–5.0)
HCT: 43.3 % (ref 39.0–52.0)
Hemoglobin: 14.4 g/dL (ref 13.0–17.0)
Lymphocytes Relative: 43.4 % (ref 12.0–46.0)
Lymphs Abs: 2.5 10*3/uL (ref 0.7–4.0)
MCHC: 33.3 g/dL (ref 30.0–36.0)
MCV: 93.5 fl (ref 78.0–100.0)
Monocytes Absolute: 0.6 10*3/uL (ref 0.1–1.0)
Monocytes Relative: 9.9 % (ref 3.0–12.0)
Neutro Abs: 2.4 10*3/uL (ref 1.4–7.7)
Neutrophils Relative %: 41.9 % — ABNORMAL LOW (ref 43.0–77.0)
Platelets: 152 10*3/uL (ref 150.0–400.0)
RBC: 4.63 Mil/uL (ref 4.22–5.81)
RDW: 13.7 % (ref 11.5–15.5)
WBC: 5.7 10*3/uL (ref 4.0–10.5)

## 2020-08-28 LAB — LIPID PANEL
Cholesterol: 156 mg/dL (ref 0–200)
HDL: 45.2 mg/dL (ref >39.00)
LDL Cholesterol: 76 mg/dL (ref 0–99)
NonHDL: 110.92
Total CHOL/HDL Ratio: 3
Triglycerides: 177 mg/dL — ABNORMAL HIGH (ref 0.0–149.0)
VLDL: 35.4 mg/dL (ref 0.0–40.0)

## 2020-08-28 LAB — HEMOGLOBIN A1C: Hgb A1c MFr Bld: 6.2 % (ref 4.6–6.5)

## 2020-08-28 LAB — TSH: TSH: 1.52 u[IU]/mL (ref 0.35–4.50)

## 2020-09-14 NOTE — Progress Notes (Signed)
CARDIOLOGY CONSULT NOTE       Patient ID: Cameron Carey MRN: [id number] DOB/AGE: [birth year] 65 y.o.  Referring Physician: Carole Civil DO Primary Physician: Wanda Plump, MD Primary Cardiologist: Eden Emms Reason for Consultation: PAF    HPI:  65 y.o. 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  Office visit 06/13/20 noted to be in asymptomatic afib. Started on eliquis 5 bid ECG reviewed from 06/13/20 afib rate 97 voltage for LVH otherwise normal   He had a bioprosthetic AVR done in [place] 06/16/2015  25 mm Sorin Crown bioprosthetic valve No CAD. Had post op afib with 3 cardioversions Was not on anticoagulation For more than a couple months I received and operative note   He drinks beer/bourbon 1/weeknight and 3-4 on weekend nights. Discussed cutting back  He has noted since December and onset of afib that he has more LE edema and exertional dyspnea. 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 Abnormal stress echo   . Allergy   . Anxiety   . Aortic valve stenosis   . Arthropathy   . Atrial fibrillation (HCC)   . Brachial radiculitis   . Edema   .  Enthesopathy of ankle   . Esophageal dysmotility   . Gout   . Hammertoe of second toe of right foot    corrected  . History of heart valve replacement   . HTN (hypertension)   . Hx of transient ischemic attack (TIA) 12/2012  . Hypercholesterolemia   . Insomnia   . Left ventricular hypertrophy   . Obesity (BMI 30-39.9)   . Orthostatic hypotension   . Syncope and collapse     Family History  Problem Relation Age of Onset  . Heart murmur Father   . Colon cancer Neg Hx   . Prostate cancer Neg Hx   . Diabetes Neg Hx     Social History   Socioeconomic History  . Marital status: Married    Spouse name: Not on file  . Number of children: 3  . Years of education: Not on file  . Highest education level: Not on file  Occupational History  . Occupation: HR director   Tobacco Use  . Smoking status: Never Smoker  . Smokeless tobacco: Never Used  Vaping Use  . Vaping Use: Never used  Substance and Sexual Activity  . Alcohol use: Yes    Comment: ~ 3 times a week  . Drug use: Never  . Sexual activity: Not on file  Other Topics Concern  . Not  on file  Social History Narrative   Moved to [facility] 06/2020    Social Determinants of Health   Financial Resource Strain: Not on file  Food Insecurity: Not on file  Transportation Needs: Not on file  Physical Activity: Not on file  Stress: Not on file  Social Connections: Not on file  Intimate Partner Violence: Not on file    Past Surgical History:  Procedure Laterality Date  . AORTIC VALVE REPLACEMENT  2016  . CARDIOVERSION  06/20/2015  . HAMMER TOE SURGERY Right 2015  . MEDIAL PARTIAL KNEE REPLACEMENT Bilateral 2020   2 procedures done 1 month apart   . METATARSAL OSTEOTOMY Right 2015   R 2nd metatarsal  . NASAL SEPTUM SURGERY  1996  . RIGHT AND LEFT HEART CATH  02/13/2015  . TONSILLECTOMY    . UVULECTOMY        Current Outpatient Medications:  .  allopurinol (ZYLOPRIM) 100 MG tablet, Take 2 tablets (200 mg total) by mouth  daily., Disp: , Rfl:  .  amLODipine (NORVASC) 5 MG tablet, Take 1 tablet (5 mg total) by mouth daily., Disp: , Rfl:  .  atorvastatin (LIPITOR) 20 MG tablet, Take 20 mg by mouth daily., Disp: , Rfl:  .  fluocinolone (VANOS) 0.01 % cream, Apply topically 2 (two) times daily., Disp: , Rfl:  .  HYDROXYZINE HCL PO, Take 25 mg by mouth., Disp: , Rfl:  .  metoprolol succinate (TOPROL-XL) 100 MG 24 hr tablet, Take 75 mg by mouth daily., Disp: , Rfl:  .  Olmesartan-amLODIPine-HCTZ 40-5-25 MG TABS, , Disp: , Rfl:  .  rivaroxaban (XARELTO) 20 MG TABS tablet, Take 1 tablet (20 mg total) by mouth daily with supper., Disp: 30 tablet, Rfl: 11 .  tadalafil (CIALIS) 20 MG tablet, Take 1 tablet (20 mg total) by mouth daily as needed for erectile dysfunction., Disp: 10 tablet, Rfl: 5 .  zolpidem (AMBIEN) 10 MG tablet, Take 0.5-1 tablets (5-10 mg total) by mouth at bedtime as needed for sleep., Disp: 30 tablet, Rfl: 0    Physical Exam: Blood pressure 130/86, pulse 68, height 6\' 2"  (1.88 m), weight 124.3 kg, SpO2 98 %.   Affect appropriate Healthy:  appears stated age HEENT: normal Neck supple with no adenopathy JVP normal no bruits no thyromegaly Lungs clear with no wheezing and good diaphragmatic motion Heart:  S1/S2 no murmur, no rub, gallop or click PMI normal Abdomen: benighn, BS positve, no tenderness, no AAA no bruit.  No HSM or HJR Distal pulses intact with no bruits No edema Neuro non-focal Skin warm and dry No muscular weakness   Labs:   Lab Results  Component Value Date   WBC 5.7 08/28/2020   HGB 14.4 08/28/2020   HCT 43.3 08/28/2020   MCV 93.5 08/28/2020   PLT 152.0 08/28/2020   No results for input(s): NA, K, CL, CO2, BUN, CREATININE, CALCIUM, PROT, BILITOT, ALKPHOS, ALT, AST, GLUCOSE in the last 168 hours.  Invalid input(s): LABALBU No results found for: CKTOTAL, CKMB, CKMBINDEX, TROPONINI  Lab Results  Component Value Date   CHOL 156 08/28/2020   Lab Results  Component  Value Date   HDL 45.20 08/28/2020   Lab Results  Component Value Date   LDLCALC 76 08/28/2020   Lab Results  Component Value Date   TRIG 177.0 (H) 08/28/2020   Lab Results  Component Value Date   CHOLHDL 3 08/28/2020   No results found for: LDLDIRECT    Radiology: No results found.  EKG: 07/25/20  afib rate 68 bpm normal ST segments    ASSESSMENT AND PLAN:   1. PAF:  Start xarelto  07/25/20 No missed doses F/U  Continue beta blocker rates are fine Risk of DCC including stroke , TMP intubation discussed wants to proceed arranged for 10/03/20 on my TAVR day Endoscopy called orders written 2. HTN:  On diuretic , ARB and Toprol hold latter prior to [facility] 3. HLD:  At goal continue statin LdL 76 08/28/20  4. Insomnia: continue PRN xanax  5. AVR:    Echo reviewed from 08/17/20 mean gradient 19 peak 33 mmHg DVI 0.24 He has a 25 mm Sorin Crown bioprosthetic valve    F/u post [facility]  Signed: Charlton Haws 09/22/2020, 11:23 AM

## 2020-09-14 NOTE — H&P (View-Only) (Signed)
CARDIOLOGY CONSULT NOTE       Patient ID: Cameron Carey MRN: [id number] DOB/AGE: [birth year] 65 y.o.  Referring Physician: Carole Civil DO Primary Physician: Wanda Plump, MD Primary Cardiologist: Eden Emms Reason for Consultation: PAF    HPI:  65 y.o. 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  Office visit 06/13/20 noted to be in asymptomatic afib. Started on eliquis 5 bid ECG reviewed from 06/13/20 afib rate 97 voltage for LVH otherwise normal   He had a bioprosthetic AVR done in [place] 65/23/2016  25 mm Sorin Crown bioprosthetic valve No CAD. Had post op afib with 3 cardioversions Was not on anticoagulation For more than a couple months I received and operative note   He drinks beer/bourbon 1/weeknight and 3-4 on weekend nights. Discussed cutting back  He has noted since December and onset of afib that he has more LE edema and exertional dyspnea. 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 Abnormal stress echo   . Allergy   . Anxiety   . Aortic valve stenosis   . Arthropathy   . Atrial fibrillation (HCC)   . Brachial radiculitis   . Edema   .  Enthesopathy of ankle   . Esophageal dysmotility   . Gout   . Hammertoe of second toe of right foot    corrected  . History of heart valve replacement   . HTN (hypertension)   . Hx of transient ischemic attack (TIA) 12/2012  . Hypercholesterolemia   . Insomnia   . Left ventricular hypertrophy   . Obesity (BMI 30-39.9)   . Orthostatic hypotension   . Syncope and collapse     Family History  Problem Relation Age of Onset  . Heart murmur Father   . Colon cancer Neg Hx   . Prostate cancer Neg Hx   . Diabetes Neg Hx     Social History   Socioeconomic History  . Marital status: Married    Spouse name: Not on file  . Number of children: 3  . Years of education: Not on file  . Highest education level: Not on file  Occupational History  . Occupation: HR director   Tobacco Use  . Smoking status: Never Smoker  . Smokeless tobacco: Never Used  Vaping Use  . Vaping Use: Never used  Substance and Sexual Activity  . Alcohol use: Yes    Comment: ~ 3 times a week  . Drug use: Never  . Sexual activity: Not on file  Other Topics Concern  . Not  on file  Social History Narrative   Moved to [place] 06/2020    Social Determinants of Health   Financial Resource Strain: Not on file  Food Insecurity: Not on file  Transportation Needs: Not on file  Physical Activity: Not on file  Stress: Not on file  Social Connections: Not on file  Intimate Partner Violence: Not on file    Past Surgical History:  Procedure Laterality Date  . AORTIC VALVE REPLACEMENT  2016  . CARDIOVERSION  06/20/2015  . HAMMER TOE SURGERY Right 2015  . MEDIAL PARTIAL KNEE REPLACEMENT Bilateral 2020   2 procedures done 1 month apart   . METATARSAL OSTEOTOMY Right 2015   R 2nd metatarsal  . NASAL SEPTUM SURGERY  1996  . RIGHT AND LEFT HEART CATH  02/13/2015  . TONSILLECTOMY    . UVULECTOMY        Current Outpatient Medications:  .  allopurinol (ZYLOPRIM) 100 MG tablet, Take 2 tablets (200 mg total) by mouth  daily., Disp: , Rfl:  .  amLODipine (NORVASC) 5 MG tablet, Take 1 tablet (5 mg total) by mouth daily., Disp: , Rfl:  .  atorvastatin (LIPITOR) 20 MG tablet, Take 20 mg by mouth daily., Disp: , Rfl:  .  fluocinolone (VANOS) 0.01 % cream, Apply topically 2 (two) times daily., Disp: , Rfl:  .  HYDROXYZINE HCL PO, Take 25 mg by mouth., Disp: , Rfl:  .  metoprolol succinate (TOPROL-XL) 100 MG 24 hr tablet, Take 75 mg by mouth daily., Disp: , Rfl:  .  Olmesartan-amLODIPine-HCTZ 40-5-25 MG TABS, , Disp: , Rfl:  .  rivaroxaban (XARELTO) 20 MG TABS tablet, Take 1 tablet (20 mg total) by mouth daily with supper., Disp: 30 tablet, Rfl: 11 .  tadalafil (CIALIS) 20 MG tablet, Take 1 tablet (20 mg total) by mouth daily as needed for erectile dysfunction., Disp: 10 tablet, Rfl: 5 .  zolpidem (AMBIEN) 10 MG tablet, Take 0.5-1 tablets (5-10 mg total) by mouth at bedtime as needed for sleep., Disp: 30 tablet, Rfl: 0    Physical Exam: Blood pressure 130/86, pulse 68, height 6' 2" (1.88 m), weight 124.3 kg, SpO2 98 %.   Affect appropriate Healthy:  appears stated age HEENT: normal Neck supple with no adenopathy JVP normal no bruits no thyromegaly Lungs clear with no wheezing and good diaphragmatic motion Heart:  S1/S2 no murmur, no rub, gallop or click PMI normal Abdomen: benighn, BS positve, no tenderness, no AAA no bruit.  No HSM or HJR Distal pulses intact with no bruits No edema Neuro non-focal Skin warm and dry No muscular weakness   Labs:   Lab Results  Component Value Date   WBC 5.7 08/28/2020   HGB 14.4 08/28/2020   HCT 43.3 08/28/2020   MCV 93.5 08/28/2020   PLT 152.0 08/28/2020   No results for input(s): NA, K, CL, CO2, BUN, CREATININE, CALCIUM, PROT, BILITOT, ALKPHOS, ALT, AST, GLUCOSE in the last 168 hours.  Invalid input(s): LABALBU No results found for: CKTOTAL, CKMB, CKMBINDEX, TROPONINI  Lab Results  Component Value Date   CHOL 156 08/28/2020   Lab Results  Component  Value Date   HDL 45.20 08/28/2020   Lab Results  Component Value Date   LDLCALC 76 08/28/2020   Lab Results  Component Value Date   TRIG 177.0 (H) 08/28/2020   Lab Results  Component Value Date   CHOLHDL 3 08/28/2020   No results found for: LDLDIRECT    Radiology: No results found.    EKG: 07/25/20  afib rate 68 bpm normal ST segments    ASSESSMENT AND PLAN:   1. PAF:  Start xarelto  07/25/20 No missed doses F/U  Continue beta blocker rates are fine Risk of DCC including stroke , TMP intubation discussed wants to proceed arranged for 10/03/20 on my TAVR day Endoscopy called orders written 2. HTN:  On diuretic , ARB and Toprol hold latter prior to [facility] 3. HLD:  At goal continue statin LdL 76 08/28/20  4. Insomnia: continue PRN xanax  5. AVR:    Echo reviewed from 08/17/20 mean gradient 19 peak 33 mmHg DVI 0.24 He has a 25 mm Sorin Crown bioprosthetic valve    F/u post [facility]  Signed: Charlton Haws 09/22/2020, 11:23 AM

## 2020-09-19 ENCOUNTER — Telehealth: Payer: Self-pay

## 2020-09-19 ENCOUNTER — Telehealth: Payer: Self-pay | Admitting: Internal Medicine

## 2020-09-19 MED ORDER — TADALAFIL 20 MG PO TABS: 20.0000 mg | ORAL_TABLET | Freq: Every day | ORAL | 5 refills | Status: DC | PRN

## 2020-09-19 NOTE — Telephone Encounter (Signed)
[facility], NEED INFORMATION ON DOCTOR IN [place]

## 2020-09-19 NOTE — Telephone Encounter (Signed)
Rx sent 

## 2020-09-19 NOTE — Telephone Encounter (Signed)
CALLED [phone] DR Briant Sites OFFICE TO GET THE AVR REPORT FAXED TO [place]

## 2020-09-19 NOTE — Telephone Encounter (Signed)
Medication: tadalafil (CIALIS) 20 MG tablet    Has the patient contacted their pharmacy? No. (If no, request that the patient contact the pharmacy for the refill.) (If yes, when and what did the pharmacy advise?)  Preferred Pharmacy (with phone number or street name):  [id number] - [place], [place] - [address] AT [address] [facility] Phone:  [phone]  Fax:  [phone]       Agent: Please be advised that RX refills may take up to 3 business days. We ask that you follow-up with your pharmacy.

## 2020-09-20 ENCOUNTER — Telehealth: Payer: Self-pay

## 2020-09-20 DIAGNOSIS — R7302 Impaired glucose tolerance (oral): Secondary | ICD-10-CM

## 2020-09-20 NOTE — Telephone Encounter (Signed)
Received medical records from [facility] in [place], [place]. Records placed in PCP yellow folder.

## 2020-09-22 ENCOUNTER — Ambulatory Visit: Payer: 59 | Admitting: Cardiovascular Disease

## 2020-09-22 ENCOUNTER — Other Ambulatory Visit: Payer: Self-pay | Admitting: Cardiovascular Disease

## 2020-09-22 ENCOUNTER — Encounter: Payer: Self-pay | Admitting: Cardiovascular Disease

## 2020-09-22 ENCOUNTER — Other Ambulatory Visit: Payer: Self-pay

## 2020-09-22 VITALS — BP 130/86 | HR 68 | Ht 74.0 in | Wt 274.0 lb

## 2020-09-22 DIAGNOSIS — E119 Type 2 diabetes mellitus without complications: Secondary | ICD-10-CM | POA: Insufficient documentation

## 2020-09-22 DIAGNOSIS — I480 Paroxysmal atrial fibrillation: Secondary | ICD-10-CM

## 2020-09-22 DIAGNOSIS — Z952 Presence of prosthetic heart valve: Secondary | ICD-10-CM

## 2020-09-22 DIAGNOSIS — R7302 Impaired glucose tolerance (oral): Secondary | ICD-10-CM | POA: Insufficient documentation

## 2020-09-22 NOTE — Telephone Encounter (Signed)
Documents reviewed. Had a colonoscopy 06/20/2008 Had a colonoscopy 05/05/2019, 1 polyp reviewed, pathology: Tubular adenoma, negative for high-grade dysplasia. Reports to be scanned  Labs 10/13/2019: A1c 5.7, total cholesterol 186, HDL 47, LDL 103

## 2020-09-22 NOTE — Patient Instructions (Addendum)
Medication Instructions:  *If you need a refill on your cardiac medications before your next appointment, please call your pharmacy*  Lab Work: If you have labs (blood work) drawn today and your tests are completely normal, you will receive your results only by: Marland Kitchen MyChart Message (if you have MyChart) OR . A paper copy in the mail If you have any lab test that is abnormal or we need to change your treatment, we will call you to review the results.  Testing/Procedures: None ordered today.   Follow-Up: At [facility], you and your health needs are our priority.  As part of our continuing mission to provide you with exceptional heart care, we have created designated Provider Care Teams.  These Care Teams include your primary Cardiologist (physician) and Advanced Practice Providers (APPs -  Physician Assistants and Nurse Practitioners) who all work together to provide you with the care you need, when you need it.  We recommend signing up for the patient portal called "MyChart".  Sign up information is provided on this After Visit Summary.  MyChart is used to connect with patients for Virtual Visits (Telemedicine).  Patients are able to view lab/test results, encounter notes, upcoming appointments, etc.  Non-urgent messages can be sent to your provider as well.   To learn more about what you can do with MyChart, go to [email or web address].    Your next appointment:   3 to 4 weeks  The format for your next appointment:   In Person  Provider:   You may see Dr. Eden Emms, A. Fib clinic or one of the following Advanced Practice Providers on your designated Care Team:    Georgie Chard, NP    You are scheduled for a Cardioversion on 10/03/20 with Dr. Eden Emms.  Please arrive at the [facility] (Main Entrance A) at [facility]: [address] [place], [place] [address] at 7:00 am.   DIET: Nothing to eat or drink after midnight except a sip of water with medications (see medication  instructions below)  Medication Instructions: Hold Metoprolol  Continue your anticoagulant: Xarelto You will need to continue your anticoagulant after your procedure until you  are told by your  Provider that it is safe to stop  Labs:   Come to the lab at [facility] between the hours of 8:00 am and 4:30 pm on 09/27/20. You do not have to be fasting.  Due to recent COVID-19 restrictions implemented by our local and state authorities and in an effort to keep both patients and staff as safe as possible, our hospital system requires COVID-19 testing prior to certain scheduled hospital procedures.  Please go to [address] [place], [place] [address] on 10/02/20 at 9:00 am  .  This is a drive up testing site.  You will not need to exit your vehicle.  You will not be billed at the time of testing but may receive a bill later depending on your insurance. You must agree to self-quarantine from the time of your testing until the procedure date on 10/03/20.  This should included staying home with ONLY the people you live with.  Avoid take-out, grocery store shopping or leaving the house for any non-emergent reason.  Failure to have your COVID-19 test done on the date and time you have been scheduled will result in cancellation of your procedure.  Please call our office at [phone] if you have any questions.   You must have a responsible person to drive you home  and stay in the waiting area during your procedure. Failure to do so could result in cancellation.  Bring your insurance cards.  *Special Note: Every effort is made to have your procedure done on time. Occasionally there are emergencies that occur at the hospital that may cause delays. Please be patient if a delay does occur.

## 2020-09-22 NOTE — Telephone Encounter (Signed)
Cscope, labs, immunizations abstracted. Records sent for scan.

## 2020-10-02 ENCOUNTER — Other Ambulatory Visit (HOSPITAL_COMMUNITY)
Admission: RE | Admit: 2020-10-02 | Discharge: 2020-10-02 | Disposition: A | Discharge status: Home / Self Care | Payer: 59 | Source: Ambulatory Visit | Attending: Cardiovascular Disease | Admitting: Cardiovascular Disease

## 2020-10-02 ENCOUNTER — Other Ambulatory Visit: Payer: Self-pay

## 2020-10-02 ENCOUNTER — Other Ambulatory Visit: Payer: 59 | Admitting: *Deleted

## 2020-10-02 DIAGNOSIS — Z20822 Contact with and (suspected) exposure to covid-19: Secondary | ICD-10-CM | POA: Diagnosis not present

## 2020-10-02 DIAGNOSIS — Z01812 Encounter for preprocedural laboratory examination: Secondary | ICD-10-CM | POA: Diagnosis not present

## 2020-10-02 DIAGNOSIS — Z952 Presence of prosthetic heart valve: Secondary | ICD-10-CM

## 2020-10-02 DIAGNOSIS — I480 Paroxysmal atrial fibrillation: Secondary | ICD-10-CM

## 2020-10-02 LAB — SARS CORONAVIRUS 2 (TAT 6-24 HRS): SARS Coronavirus 2: NEGATIVE

## 2020-10-02 LAB — CBC WITH DIFFERENTIAL/PLATELET
Basophils Absolute: 0 10*3/uL (ref 0.0–0.2)
Basos: 0 %
EOS (ABSOLUTE): 0.3 10*3/uL (ref 0.0–0.4)
Eos: 4 %
Hematocrit: 41.1 % (ref 37.5–51.0)
Hemoglobin: 13.8 g/dL (ref 13.0–17.7)
Lymphocytes Absolute: 2.4 10*3/uL (ref 0.7–3.1)
Lymphs: 39 %
MCH: 30.5 pg (ref 26.6–33.0)
MCHC: 33.6 g/dL (ref 31.5–35.7)
MCV: 91 fL (ref 79–97)
Monocytes Absolute: 0.7 10*3/uL (ref 0.1–0.9)
Monocytes: 11 %
Neutrophils Absolute: 2.8 10*3/uL (ref 1.4–7.0)
Neutrophils: 46 %
Platelets: 150 10*3/uL (ref 150–450)
RBC: 4.52 x10E6/uL (ref 4.14–5.80)
RDW: 13.9 % (ref 11.6–15.4)
WBC: 6.1 10*3/uL (ref 3.4–10.8)

## 2020-10-02 LAB — BASIC METABOLIC PANEL
BUN/Creatinine Ratio: 21 (ref 10–24)
BUN: 26 mg/dL (ref 8–27)
CO2: 25 mmol/L (ref 20–29)
Calcium: 9.4 mg/dL (ref 8.6–10.2)
Chloride: 102 mmol/L (ref 96–106)
Creatinine, Ser: 1.22 mg/dL (ref 0.76–1.27)
Glucose: 91 mg/dL (ref 65–99)
Potassium: 3 mmol/L — ABNORMAL LOW (ref 3.5–5.2)
Sodium: 140 mmol/L (ref 134–144)
eGFR: 66 mL/min/{1.73_m2} (ref >59)

## 2020-10-03 ENCOUNTER — Ambulatory Visit (HOSPITAL_COMMUNITY): Payer: 59 | Admitting: Anesthesiology

## 2020-10-03 ENCOUNTER — Encounter (HOSPITAL_COMMUNITY)
Admission: RE | Disposition: A | Discharge status: Home / Self Care | Payer: Self-pay | Source: Home / Self Care | Attending: Cardiovascular Disease

## 2020-10-03 ENCOUNTER — Encounter (HOSPITAL_COMMUNITY): Payer: Self-pay | Admitting: Cardiovascular Disease

## 2020-10-03 ENCOUNTER — Other Ambulatory Visit: Payer: Self-pay

## 2020-10-03 ENCOUNTER — Ambulatory Visit (HOSPITAL_COMMUNITY)
Admission: RE | Admit: 2020-10-03 | Discharge: 2020-10-03 | Disposition: A | Discharge status: Home / Self Care | Payer: 59 | Attending: Cardiovascular Disease | Admitting: Cardiovascular Disease

## 2020-10-03 DIAGNOSIS — Z8249 Family history of ischemic heart disease and other diseases of the circulatory system: Secondary | ICD-10-CM | POA: Insufficient documentation

## 2020-10-03 DIAGNOSIS — Z79899 Other long term (current) drug therapy: Secondary | ICD-10-CM | POA: Insufficient documentation

## 2020-10-03 DIAGNOSIS — Z6834 Body mass index (BMI) 34.0-34.9, adult: Secondary | ICD-10-CM | POA: Insufficient documentation

## 2020-10-03 DIAGNOSIS — Z7901 Long term (current) use of anticoagulants: Secondary | ICD-10-CM | POA: Diagnosis not present

## 2020-10-03 DIAGNOSIS — Z96653 Presence of artificial knee joint, bilateral: Secondary | ICD-10-CM | POA: Insufficient documentation

## 2020-10-03 DIAGNOSIS — I480 Paroxysmal atrial fibrillation: Secondary | ICD-10-CM

## 2020-10-03 DIAGNOSIS — G4700 Insomnia, unspecified: Secondary | ICD-10-CM | POA: Insufficient documentation

## 2020-10-03 DIAGNOSIS — E669 Obesity, unspecified: Secondary | ICD-10-CM | POA: Diagnosis not present

## 2020-10-03 DIAGNOSIS — I10 Essential (primary) hypertension: Secondary | ICD-10-CM | POA: Diagnosis not present

## 2020-10-03 DIAGNOSIS — Z953 Presence of xenogenic heart valve: Secondary | ICD-10-CM | POA: Insufficient documentation

## 2020-10-03 DIAGNOSIS — E785 Hyperlipidemia, unspecified: Secondary | ICD-10-CM | POA: Diagnosis not present

## 2020-10-03 DIAGNOSIS — E7800 Pure hypercholesterolemia, unspecified: Secondary | ICD-10-CM | POA: Insufficient documentation

## 2020-10-03 DIAGNOSIS — Z8673 Personal history of transient ischemic attack (TIA), and cerebral infarction without residual deficits: Secondary | ICD-10-CM | POA: Insufficient documentation

## 2020-10-03 HISTORY — PX: CARDIOVERSION: SHX1299

## 2020-10-03 SURGERY — CARDIOVERSION
Anesthesia: General

## 2020-10-03 MED ORDER — LIDOCAINE HCL (CARDIAC) PF 100 MG/5ML IV SOSY
PREFILLED_SYRINGE | INTRAVENOUS | Status: DC | PRN
  Administered 2020-10-03: 60 mg via INTRATRACHEAL

## 2020-10-03 MED ORDER — SODIUM CHLORIDE 0.9 % IV SOLN: INTRAVENOUS | Status: DC | PRN

## 2020-10-03 MED ORDER — PROPOFOL 10 MG/ML IV BOLUS
INTRAVENOUS | Status: DC | PRN
  Administered 2020-10-03: 30 mg via INTRAVENOUS
  Administered 2020-10-03: 50 mg via INTRAVENOUS
  Administered 2020-10-03: 30 mg via INTRAVENOUS
  Administered 2020-10-03: 20 mg via INTRAVENOUS

## 2020-10-03 NOTE — Anesthesia Preprocedure Evaluation (Addendum)
Anesthesia Evaluation  Patient identified by MRN, date of birth, ID band Patient awake    Reviewed: Allergy & Precautions, NPO status , Patient's Chart, lab work & pertinent test results, reviewed documented beta blocker date and time   Airway Mallampati: III  TM Distance: >3 FB Neck ROM: Full    Dental no notable dental hx. (+) Teeth Intact, Dental Advisory Given   Pulmonary neg pulmonary ROS,    Pulmonary exam normal breath sounds clear to auscultation       Cardiovascular hypertension, Pt. on medications and Pt. on home beta blockers Normal cardiovascular exam+ dysrhythmias (xarelto, no missed doses) Atrial Fibrillation + Valvular Problems/Murmurs (AS s/p AVR 2016) AS  Rhythm:Irregular Rate:Normal  TTE 2022 1. Left ventricular ejection fraction, by estimation, is 55 to 60%. The left ventricle has normal function. The left ventricle has no regional wall motion abnormalities. Left ventricular diastolic parameters are indeterminate.  2. Right ventricular systolic function is mildly reduced. The right ventricular size is moderately enlarged. There is normal pulmonary artery systolic pressure. The estimated right ventricular systolic pressure is  33.0 mmHg. D-shaped interventricular  septum suggestive of RV pressure/volume overload.  3. Right atrial size was mildly dilated.  4. The mitral valve is normal in structure. Trivial mitral valve regurgitation. No evidence of mitral stenosis. Moderate mitral annular calcification.  5. There is a bioprosthetic aortic valve. Mean gradient mildly elevated,  19 mmHg. No significant regurgitation.  6. The inferior vena cava is dilated in size with <50% respiratory variability, suggesting right atrial pressure of 15 mmHg.  7. The patient was in atrial fibrillation.    Neuro/Psych PSYCHIATRIC DISORDERS Anxiety TIA   GI/Hepatic negative GI ROS, Neg liver ROS,   Endo/Other  negative endocrine  ROS  Renal/GU negative Renal ROS  negative genitourinary   Musculoskeletal negative musculoskeletal ROS (+)   Abdominal   Peds  Hematology negative hematology ROS (+)   Anesthesia Other Findings   Reproductive/Obstetrics                            Anesthesia Physical Anesthesia Plan  ASA: III  Anesthesia Plan: General   Post-op Pain Management:    Induction: Intravenous  PONV Risk Score and Plan: 2 and Propofol infusion and Treatment may vary due to age or medical condition  Airway Management Planned: Natural Airway  Additional Equipment:   Intra-op Plan:   Post-operative Plan:   Informed Consent: I have reviewed the patients History and Physical, chart, labs and discussed the procedure including the risks, benefits and alternatives for the proposed anesthesia with the patient or authorized representative who has indicated his/her understanding and acceptance.     Dental advisory given  Plan Discussed with: CRNA  Anesthesia Plan Comments:        Anesthesia Quick Evaluation

## 2020-10-03 NOTE — Discharge Instructions (Signed)
Electrical Cardioversion Electrical cardioversion is the delivery of a jolt of electricity to restore a normal rhythm to the heart. A rhythm that is too fast or is not regular keeps the heart from pumping well. In this procedure, sticky patches or metal paddles are placed on the chest to deliver electricity to the heart from a device. This procedure may be done in an emergency if:  There is low or no blood pressure as a result of the heart rhythm.  Normal rhythm must be restored as fast as possible to protect the brain and heart from further damage.  It may save a life. This may also be a scheduled procedure for irregular or fast heart rhythms that are not immediately life-threatening. Tell a health care provider about:  Any allergies you have.  All medicines you are taking, including vitamins, herbs, eye drops, creams, and over-the-counter medicines.  Any problems you or family members have had with anesthetic medicines.  Any blood disorders you have.  Any surgeries you have had.  Any medical conditions you have.  Whether you are pregnant or may be pregnant. What are the risks? Generally, this is a safe procedure. However, problems may occur, including:  Allergic reactions to medicines.  A blood clot that breaks free and travels to other parts of your body.  The possible return of an abnormal heart rhythm within hours or days after the procedure.  Your heart stopping (cardiac arrest). This is rare.   What can I expect after the procedure?  Your blood pressure, heart rate, breathing rate, and blood oxygen level will be monitored until you leave the hospital or clinic.  Your heart rhythm will be watched to make sure it does not change.  You may have some redness on the skin where the shocks were given. Follow these instructions at home:  Do not drive for 24 hours if you were given a sedative during your procedure.  Take over-the-counter and prescription medicines only  as told by your health care provider.  Ask your health care provider how to check your pulse. Check it often.  Rest for 48 hours after the procedure or as told by your health care provider.  Avoid or limit your caffeine use as told by your health care provider.  Keep all follow-up visits as told by your health care provider. This is important. Contact a health care provider if:  You feel like your heart is beating too quickly or your pulse is not regular.  You have a serious muscle cramp that does not go away. Get help right away if:  You have discomfort in your chest.  You are dizzy or you feel faint.  You have trouble breathing or you are short of breath.  Your speech is slurred.  You have trouble moving an arm or leg on one side of your body.  Your fingers or toes turn cold or blue. Summary  Electrical cardioversion is the delivery of a jolt of electricity to restore a normal rhythm to the heart.  This procedure may be done right away in an emergency or may be a scheduled procedure if the condition is not an emergency.  Generally, this is a safe procedure.  After the procedure, check your pulse often as told by your health care provider. This information is not intended to replace advice given to you by your health care provider. Make sure you discuss any questions you have with your health care provider. Document Revised: 01/11/2019 Document Reviewed: 01/11/2019 Elsevier   Patient Education  2021 [employer] Inc[employer] 

## 2020-10-03 NOTE — Interval H&P Note (Signed)
History and Physical Interval Note:  10/03/2020 8:15 AM  Cameron Carey  has presented today for surgery, with the diagnosis of AFIB.  The various methods of treatment have been discussed with the patient and family. After consideration of risks, benefits and other options for treatment, the patient has consented to  Procedure(s): CARDIOVERSION (N/A) as a surgical intervention.  The patient's history has been reviewed, patient examined, no change in status, stable for surgery.  I have reviewed the patient's chart and labs.  Questions were answered to the patient's satisfaction.     Charlton Haws

## 2020-10-03 NOTE — CV Procedure (Signed)
DCC: Anesthesia: Propofol  DCC x 2 150J-> 200 J biphasic Converted from afb rate 88 to NSR rate 74 bpm  On Rx anticoagulation with no missed doses No immediate neurologic sequelae  Charlton Haws MD [facility] [place]

## 2020-10-03 NOTE — Transfer of Care (Signed)
Immediate Anesthesia Transfer of Care Note  Patient: Cameron Carey  Procedure(s) Performed: CARDIOVERSION (N/A )  Patient Location: Endoscopy Unit  Anesthesia Type:General  Level of Consciousness: awake and alert   Airway & Oxygen Therapy: Patient Spontanous Breathing  Post-op Assessment: Report given to RN and Post -op Vital signs reviewed and stable  Post vital signs: Reviewed and stable  Last Vitals:  Vitals Value Taken Time  BP    Temp    Pulse    Resp    SpO2      Last Pain:  Vitals:   10/03/20 0725  TempSrc: Tympanic  PainSc: 0-No pain         Complications: No complications documented.

## 2020-10-04 ENCOUNTER — Encounter (HOSPITAL_COMMUNITY): Payer: Self-pay | Admitting: Cardiovascular Disease

## 2020-10-04 NOTE — Anesthesia Postprocedure Evaluation (Signed)
Anesthesia Post Note  Patient: Cameron Carey  Procedure(s) Performed: CARDIOVERSION (N/A )     Patient location during evaluation: Endoscopy Anesthesia Type: General Level of consciousness: awake and alert Pain management: pain level controlled Vital Signs Assessment: post-procedure vital signs reviewed and stable Respiratory status: spontaneous breathing, nonlabored ventilation, respiratory function stable and patient connected to nasal cannula oxygen Cardiovascular status: blood pressure returned to baseline and stable Postop Assessment: no apparent nausea or vomiting Anesthetic complications: no   No complications documented.  Last Vitals:  Vitals:   10/03/20 0830 10/03/20 0840  BP: 120/76 128/72  Pulse: (!) 58 60  Resp: 20 (!) 22  Temp:    SpO2: 93% 97%    Last Pain:  Vitals:   10/03/20 0840  TempSrc:   PainSc: 0-No pain                 [name] [name]

## 2020-10-17 ENCOUNTER — Other Ambulatory Visit: Payer: Self-pay

## 2020-10-17 ENCOUNTER — Encounter (HOSPITAL_COMMUNITY): Payer: Self-pay | Admitting: Nurse Practitioner

## 2020-10-17 ENCOUNTER — Ambulatory Visit (HOSPITAL_COMMUNITY)
Admission: RE | Admit: 2020-10-17 | Discharge: 2020-10-17 | Disposition: A | Discharge status: Home / Self Care | Payer: 59 | Source: Ambulatory Visit | Attending: Nurse Practitioner | Admitting: Nurse Practitioner

## 2020-10-17 VITALS — BP 104/74 | HR 84 | Ht 74.0 in | Wt 270.2 lb

## 2020-10-17 DIAGNOSIS — D6869 Other thrombophilia: Secondary | ICD-10-CM | POA: Diagnosis not present

## 2020-10-17 DIAGNOSIS — Z888 Allergy status to other drugs, medicaments and biological substances status: Secondary | ICD-10-CM | POA: Insufficient documentation

## 2020-10-17 DIAGNOSIS — Z882 Allergy status to sulfonamides status: Secondary | ICD-10-CM | POA: Diagnosis not present

## 2020-10-17 DIAGNOSIS — Z79899 Other long term (current) drug therapy: Secondary | ICD-10-CM | POA: Insufficient documentation

## 2020-10-17 DIAGNOSIS — I4819 Other persistent atrial fibrillation: Secondary | ICD-10-CM | POA: Insufficient documentation

## 2020-10-17 DIAGNOSIS — I10 Essential (primary) hypertension: Secondary | ICD-10-CM | POA: Insufficient documentation

## 2020-10-17 DIAGNOSIS — Z7901 Long term (current) use of anticoagulants: Secondary | ICD-10-CM | POA: Insufficient documentation

## 2020-10-17 DIAGNOSIS — E785 Hyperlipidemia, unspecified: Secondary | ICD-10-CM | POA: Insufficient documentation

## 2020-10-17 NOTE — Progress Notes (Addendum)
Primary Care Physician: Wanda Plump, MD Referring Physician: Dr. Clemmie Krill is a 65 y.o. 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  He had new onset  afib s/p surgery and required CV x 3. No CAD prior to surgery by cath. 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   He was  advised to f/u here as he was moving at that time. He established with Dr. Eden Emms  and was scheduled for cardioversion, after being stared on xarelto 07/25/20 for a CHA2DS2VASc score of at least 3.   He had successful cardioversion but has had ERAF. He is here to discuss means of restoring SR. He is not symptomatic in afib but would like to restore SR.   Today, he denies symptoms of palpitations, chest pain, shortness of breath, orthopnea, PND, lower extremity edema, dizziness, presyncope, syncope, or neurologic sequela. The patient is tolerating medications without difficulties and is otherwise without complaint today.   Past Medical History:  Diagnosis Date  . Abnormal stress echo   . Allergy   . Anxiety   . Aortic valve stenosis   . Arthropathy   . Atrial fibrillation (HCC)   . Brachial radiculitis   . Edema   . Enthesopathy of ankle   . Esophageal dysmotility   . Gout   . Hammertoe of second toe of right foot    corrected  . History of heart valve replacement   . HTN (hypertension)   . Hx of transient ischemic attack (TIA) 12/2012  . Hypercholesterolemia   . Insomnia   . Left ventricular hypertrophy   . Obesity (BMI 30-39.9)   . Orthostatic hypotension   . Syncope and collapse    Past Surgical History:  Procedure Laterality Date  . AORTIC VALVE REPLACEMENT  2016  . CARDIOVERSION  06/20/2015  . CARDIOVERSION N/A 10/03/2020   Procedure: CARDIOVERSION;  Surgeon: Wendall Stade, MD;  Location: [facility] ENDOSCOPY;  Service:  Cardiovascular;  Laterality: N/A;  . HAMMER TOE SURGERY Right 2015  . MEDIAL PARTIAL KNEE REPLACEMENT Bilateral 2020   2 procedures done 1 month apart   . METATARSAL OSTEOTOMY Right 2015   R 2nd metatarsal  . NASAL SEPTUM SURGERY  1996  . RIGHT AND LEFT HEART CATH  02/13/2015  . TONSILLECTOMY    . UVULECTOMY      Current Outpatient Medications  Medication Sig Dispense Refill  . allopurinol (ZYLOPRIM) 100 MG tablet Take 2 tablets (200 mg total) by mouth daily.    Marland Kitchen amLODipine (NORVASC) 5 MG tablet Take 1 tablet (5 mg total) by mouth daily.    Marland Kitchen atorvastatin (LIPITOR) 20 MG tablet Take 20 mg by mouth daily.    . fluocinolone (VANOS) 0.01 % cream Apply 1 application topically daily as needed (dry skin).    . hydrOXYzine (ATARAX/VISTARIL) 25 MG tablet Take 25 mg by mouth 3 (three) times daily as needed for itching.    . metoprolol succinate (TOPROL-XL) 100 MG 24 hr tablet Take 150 mg by mouth daily.    . Olmesartan-amLODIPine-HCTZ 40-5-25 MG TABS Take 1 tablet by mouth daily.    . rivaroxaban (XARELTO) 20 MG TABS tablet Take 1 tablet (20 mg total) by mouth daily with supper. 30 tablet 11  . tadalafil (CIALIS) 20  MG tablet Take 1 tablet (20 mg total) by mouth daily as needed for erectile dysfunction. 10 tablet 5  . zolpidem (AMBIEN) 10 MG tablet Take 0.5-1 tablets (5-10 mg total) by mouth at bedtime as needed for sleep. 30 tablet 0   No current facility-administered medications for this encounter.    Allergies  Allergen Reactions  . Acetazolamide Other (See Comments)    Passed out  . Bactrim [Sulfamethoxazole-Trimethoprim]   . Statins Other (See Comments)    Other reaction(s): Unspecified     Social History   Socioeconomic History  . Marital status: Married    Spouse name: Not on file  . Number of children: 3  . Years of education: Not on file  . Highest education level: Not on file  Occupational History  . Occupation: HR director   Tobacco Use  . Smoking status: Never  Smoker  . Smokeless tobacco: Never Used  Vaping Use  . Vaping Use: Never used  Substance and Sexual Activity  . Alcohol use: Yes    Alcohol/week: 2.0 standard drinks    Types: 2 Standard drinks or equivalent per week    Comment: ~ 3 times a week  . Drug use: Never  . Sexual activity: Not on file  Other Topics Concern  . Not on file  Social History Narrative   Moved to [facility] 06/2020    Social Determinants of Health   Financial Resource Strain: Not on file  Food Insecurity: Not on file  Transportation Needs: Not on file  Physical Activity: Not on file  Stress: Not on file  Social Connections: Not on file  Intimate Partner Violence: Not on file    Family History  Problem Relation Age of Onset  . Heart murmur Father   . Colon cancer Neg Hx   . Prostate cancer Neg Hx   . Diabetes Neg Hx     ROS- All systems are reviewed and negative except as per the HPI above  Physical Exam: Vitals:   10/17/20 0905  BP: 104/74  Pulse: 84  Weight: 122.6 kg  Height: 6\' 2"  (1.88 m)   Wt Readings from Last 3 Encounters:  10/17/20 122.6 kg  10/03/20 120.2 kg  09/22/20 124.3 kg    Labs: Lab Results  Component Value Date   NA 140 10/02/2020   K 3.0 (L) 10/02/2020   CL 102 10/02/2020   CO2 25 10/02/2020   GLUCOSE 91 10/02/2020   BUN 26 10/02/2020   CREATININE 1.22 10/02/2020   CALCIUM 9.4 10/02/2020   No results found for: INR Lab Results  Component Value Date   CHOL 156 08/28/2020   HDL 45.20 08/28/2020   LDLCALC 76 08/28/2020   TRIG 177.0 (H) 08/28/2020     GEN- The patient is well appearing, alert and oriented x 3 today.   Head- normocephalic, atraumatic Eyes-  Sclera clear, conjunctiva pink Ears- hearing intact Oropharynx- clear Neck- supple, no JVP Lymph- no cervical lymphadenopathy Lungs- Clear to ausculation bilaterally, normal work of breathing Heart- irregular rate and rhythm, no murmurs, rubs or gallops, PMI not laterally displaced GI- soft, NT, ND, +  BS Extremities- no clubbing, cyanosis, or edema MS- no significant deformity or atrophy Skin- no rash or lesion Psych- euthymic mood, full affect Neuro- strength and sensation are intact  EKG-afib at 84 bpm, qrs int 86 bpm, qtc 451 ms   EKG at time of cardioversion- Vent. rate 68 BPM PR interval 230 ms QRS duration 94 ms QT/QTcB 448/476 ms P-R-T  axes 65 53 55 Sinus rhythm with 1st degree A-V block with Premature atrial complexes First degree A-V block  No old tracing in SR  to compare  Echo -08/17/20-1. Left ventricular ejection fraction, by estimation, is 55 to 60%. The  left ventricle has normal function. The left ventricle has no regional  wall motion abnormalities. Left ventricular diastolic parameters are  indeterminate.  2. Right ventricular systolic function is mildly reduced. The right  ventricular size is moderately enlarged. There is normal pulmonary artery  systolic pressure. The estimated right ventricular systolic pressure is  33.0 mmHg. D-shaped interventricular  septum suggestive of RV pressure/volume overload.  3. Right atrial size was mildly dilated.  4. The mitral valve is normal in structure. Trivial mitral valve  regurgitation. No evidence of mitral stenosis. Moderate mitral annular  calcification.  5. There is a bioprosthetic aortic valve. Mean gradient mildly elevated,  19 mmHg. No significant regurgitation.  6. The inferior vena cava is dilated in size with <50% respiratory  variability, suggesting right atrial pressure of 15 mmHg.  7. The patient was in atrial fibrillation.   FINDINGS  Left Ventricle: Left ventricular ejection fraction, by estimation, is 55  to 60%. The left ventricle has normal function. The left ventricle has no  regional wall motion abnormalities. The left ventricular internal cavity  size was normal in size. There is  no left ventricular hypertrophy. Left ventricular diastolic parameters  are indeterminate.   Right  Ventricle: The right ventricular size is moderately enlarged. No  increase in right ventricular wall thickness. Right ventricular systolic  function is mildly reduced. There is normal pulmonary artery systolic  pressure. The tricuspid regurgitant  velocity is 2.12 m/s, and with an assumed right atrial pressure of 15  mmHg, the estimated right ventricular systolic pressure is 33.0 mmHg.     Assessment and Plan: 1. Persistent  afib Quiet after valve surgery in 2016 until December of 2021 Successful cardioversion 10/03/20 but ERAF I discussed options to restore SR  I have concerns with flecainide with first degree AV block present in SR on EKG after CV, with PR int at 230 ms  I have concerns with Tikosyn as his qtc was 476 ms on same EKG He is on young side for amiodarone  I will refer to EP to discuss options including front  line ablation   2. CHA2DS2VASc score of 3 Continue  xarelto 20 mg daily   3. AVR Per Dr. Eden Emms   4. HTN Stable   To EP for consult   Elvina Sidle. [facility] [address] [place], [place] [address] [phone]

## 2020-10-30 ENCOUNTER — Other Ambulatory Visit: Payer: Self-pay | Admitting: Internal Medicine

## 2020-10-30 NOTE — Telephone Encounter (Signed)
Prescription was sent by me in March, PDMP reviewed, apparently he never picked it up.  A new prescription sent today.

## 2020-10-30 NOTE — Telephone Encounter (Signed)
Requesting: Ambien 10mg  Contract: n/a UDS: n/a  Last Visit: 08/25/2020 Next Visit: 11/27/2020 Last Refill: 08/25/2020 #30 and 0RF  Please Advise

## 2020-11-13 NOTE — Progress Notes (Signed)
Electrophysiology Office Note:    Date:  11/14/2020   ID:  Cameron Carey, DOB [birth year], MRN [id number]  PCP:  Wanda Plump, MD  [facility] Cardiologist:  None  [facility] Electrophysiologist:  Lanier Prude, MD   Referring MD: Cameron Nip, NP   Chief Complaint: Persistent atrial fibrillation  History of Present Illness:    Cameron Carey is a 65 y.o. male who presents for an evaluation of persistent atrial fibrillation at the request of Cameron Coco, NP. Their medical history includes aortic stenosis post aortic valve replacement in 2017, history of TIA, obesity, hypertension, hypercholesterolemia.  The patient was seen by Cameron Carey on October 17, 2020.  The patient's diagnosis of atrial fibrillation dates back to his valve replacement surgery performed on June 16, 2015 in [place], [place].  After surgery he required 3 cardioversions but then his atrial arrhythmias quieted until moving to [place].  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  He is followed by Dr. Eden Carey.  He is on Xarelto for stroke prophylaxis. While he is in atrial fibrillation he cannot appreciate any palpitations but does note a decreased exercise tolerance.  No syncope or presyncope.  No chest pain or shortness of breath.   Past Medical History:  Diagnosis Date  . Abnormal stress echo   . Allergy   . Anxiety   . Aortic valve stenosis   . Arthropathy   . Atrial fibrillation (HCC)   . Brachial radiculitis   . Edema   . Enthesopathy of ankle   . Esophageal dysmotility   . Gout   . Hammertoe of second toe of right foot    corrected  . History of heart valve replacement   . HTN (hypertension)   . Hx of transient ischemic attack (TIA) 12/2012  . Hypercholesterolemia   . Insomnia   . Left ventricular hypertrophy   . Obesity (BMI 30-39.9)   . Orthostatic hypotension   . Syncope and collapse     Past Surgical History:  Procedure Laterality Date  . AORTIC VALVE  REPLACEMENT  2016  . CARDIOVERSION  06/20/2015  . CARDIOVERSION N/A 10/03/2020   Procedure: CARDIOVERSION;  Surgeon: Cameron Stade, MD;  Location: [facility] ENDOSCOPY;  Service: Cardiovascular;  Laterality: N/A;  . HAMMER TOE SURGERY Right 2015  . MEDIAL PARTIAL KNEE REPLACEMENT Bilateral 2020   2 procedures done 1 month apart   . METATARSAL OSTEOTOMY Right 2015   R 2nd metatarsal  . NASAL SEPTUM SURGERY  1996  . RIGHT AND LEFT HEART CATH  02/13/2015  . TONSILLECTOMY    . UVULECTOMY      Current Medications: Current Meds  Medication Sig  . allopurinol (ZYLOPRIM) 100 MG tablet Take 2 tablets (200 mg total) by mouth daily.  Marland Kitchen amLODipine (NORVASC) 5 MG tablet Take 1 tablet (5 mg total) by mouth daily.  Marland Kitchen atorvastatin (LIPITOR) 20 MG tablet Take 20 mg by mouth daily.  . fluocinolone (VANOS) 0.01 % cream Apply 1 application topically daily as needed (dry skin).  . hydrOXYzine (ATARAX/VISTARIL) 25 MG tablet Take 25 mg by mouth 3 (three) times daily as needed for itching.  . metoprolol succinate (TOPROL-XL) 100 MG 24 hr tablet Take 150 mg by mouth daily.  . Olmesartan-amLODIPine-HCTZ 40-5-25 MG TABS Take 1 tablet by mouth daily.  . rivaroxaban (XARELTO) 20 MG TABS tablet Take 1 tablet (20 mg total) by mouth daily with supper.  . tadalafil (CIALIS) 20 MG tablet Take  1 tablet (20 mg total) by mouth daily as needed for erectile dysfunction.  Marland Kitchen zolpidem (AMBIEN) 10 MG tablet TAKE 1/2 TO 1 TABLET BY MOUTH AT BEDTIME AS NEEDED FOR SLEEP *INS MAX #15/25 DAYS*     Allergies:   Acetazolamide, Bactrim [sulfamethoxazole-trimethoprim], and Statins   Social History   Socioeconomic History  . Marital status: Married    Spouse name: Not on file  . Number of children: 3  . Years of education: Not on file  . Highest education level: Not on file  Occupational History  . Occupation: HR director   Tobacco Use  . Smoking status: Never Smoker  . Smokeless tobacco: Never Used  Vaping Use  . Vaping Use:  Never used  Substance and Sexual Activity  . Alcohol use: Yes    Alcohol/week: 2.0 standard drinks    Types: 2 Standard drinks or equivalent per week    Comment: ~ 3 times a week  . Drug use: Never  . Sexual activity: Not on file  Other Topics Concern  . Not on file  Social History Narrative   Moved to [facility] 06/2020    Social Determinants of Health   Financial Resource Strain: Not on file  Food Insecurity: Not on file  Transportation Needs: Not on file  Physical Activity: Not on file  Stress: Not on file  Social Connections: Not on file     Family History: The patient's family history includes Heart murmur in his father. There is no history of Colon cancer, Prostate cancer, or Diabetes.  ROS:   Please see the history of present illness.    All other systems reviewed and are negative.  EKGs/Labs/Other Studies Reviewed:    The following studies were reviewed today:  August 17, 2020 echo personally reviewed Left ventricular function normal, 55% Right ventricular function mildly reduced RVSP 33 mmHg Mildly dilated right atrium Trivial MR Bioprosthetic aortic valve with a mean gradient of 19 mmHg  normal atrial size  EKG:  The ekg ordered today demonstrates atrial fibrillation  Recent Labs: 08/28/2020: ALT 21; TSH 1.52 10/02/2020: BUN 26; Creatinine, Ser 1.22; Hemoglobin 13.8; Platelets 150; Potassium 3.0; Sodium 140  Recent Lipid Panel    Component Value Date/Time   CHOL 156 08/28/2020 0727   TRIG 177.0 (H) 08/28/2020 0727   HDL 45.20 08/28/2020 0727   CHOLHDL 3 08/28/2020 0727   VLDL 35.4 08/28/2020 0727   LDLCALC 76 08/28/2020 0727    Physical Exam:    VS:  BP 122/84   Pulse 71   Ht 6\' 2"  (1.88 m)   Wt 272 lb (123.4 kg)   SpO2 99%   BMI 34.92 kg/m     Wt Readings from Last 3 Encounters:  11/14/20 272 lb (123.4 kg)  10/17/20 270 lb 3.2 oz (122.6 kg)  10/03/20 265 lb (120.2 kg)     GEN:  Well nourished, well developed in no acute distress.   Obese HEENT: Normal NECK: No JVD; No carotid bruits LYMPHATICS: No lymphadenopathy CARDIAC: Irregularly irregular, no murmurs, rubs, gallops RESPIRATORY:  Clear to auscultation without rales, wheezing or rhonchi  ABDOMEN: Soft, non-tender, non-distended MUSCULOSKELETAL:  No edema; No deformity  SKIN: Warm and dry NEUROLOGIC:  Alert and oriented x 3 PSYCHIATRIC:  Normal affect   ASSESSMENT:    1. Persistent atrial fibrillation (HCC)   2. Primary hypertension   3. Morbid obesity (HCC)    PLAN:    In order of problems listed above:  1. Persistent atrial fibrillation Controlled  ventricular rates.  Symptomatic with decreased exercise tolerance.  During today's visit we discussed the treatment options available for his atrial fibrillation including continued conservative management with rate control and stroke prophylaxis, rhythm control using antiarrhythmic therapy and rhythm control using ablation.  I agree with Darvin Neighbours assessment that his PR and QTC precludes safe use of class Ic and class III antiarrhythmic drugs.  I think he is too young for upfront use of amiodarone.  Given the persistent nature of his atrial fibrillation I do not think he is a candidate for dronedarone.  Given the above, I think ablation would be the patient's best option to restore normal rhythm.  I discussed the ablation procedure in detail with the patient during today's visit including the risks, expected recovery time and expected efficacy.  He will continue his Xarelto uninterrupted around the time of the procedure.  Risk, benefits, and alternatives to EP study and radiofrequency ablation for afib were also discussed in detail today. These risks include but are not limited to stroke, bleeding, vascular damage, tamponade, perforation, damage to the esophagus, lungs, and other structures, pulmonary vein stenosis, worsening renal function, and death. The patient understands these risk and wishes to proceed.  We  will therefore proceed with catheter ablation at the next available time.  Carto, ICE, anesthesia are requested for the procedure.  Will also obtain transesophageal echocardiogram prior to the procedure to exclude LAA thrombus and further evaluate atrial anatomy.  2.  Morbid obesity BMI 34.  We discussed the link between obesity and ablation outcomes in detail during today's visit.  The patient will aggressively work on weight reduction between now and the time of his ablation.  The goal would be for him to lose 10 to 20 pounds prior to the ablation which he thinks is feasible.  3.  Snoring, increased neck circumference, daytime sleepiness I suspect sleep apnea.  We discussed the link between untreated sleep apnea and atrial fibrillation ablation outcomes.  I will order a home sleep study for him today.  Total time spent with patient today 65 minutes. This includes reviewing records, evaluating the patient and coordinating care.  Medication Adjustments/Labs and Tests Ordered: Current medicines are reviewed at length with the patient today.  Concerns regarding medicines are outlined above.  Orders Placed This Encounter  Procedures  . EKG 12-Lead  . [place] Sleep Study   No orders of the defined types were placed in this encounter.    Signed, Rossie Muskrat. Lalla Brothers, MD, [facility], [facility] 11/14/2020 9:07 PM    Electrophysiology [facility]

## 2020-11-14 ENCOUNTER — Encounter: Payer: Self-pay | Admitting: *Deleted

## 2020-11-14 ENCOUNTER — Other Ambulatory Visit: Payer: Self-pay

## 2020-11-14 ENCOUNTER — Encounter: Payer: Self-pay | Admitting: Cardiology

## 2020-11-14 ENCOUNTER — Ambulatory Visit: Payer: 59 | Admitting: Cardiology

## 2020-11-14 VITALS — BP 122/84 | HR 71 | Ht 74.0 in | Wt 272.0 lb

## 2020-11-14 DIAGNOSIS — I4819 Other persistent atrial fibrillation: Secondary | ICD-10-CM

## 2020-11-14 DIAGNOSIS — I10 Essential (primary) hypertension: Secondary | ICD-10-CM

## 2020-11-14 NOTE — Patient Instructions (Addendum)
Medication Instructions:  Your physician recommends that you continue on your current medications as directed. Please refer to the Current Medication list given to you today.  Labwork: None ordered.  Testing/Procedures: Your physician has requested that you have a TEE. During a TEE, sound waves are used to create images of your heart. It provides your doctor with information about the size and shape of your heart and how well your heart's chambers and valves are working. In this test, a transducer is attached to the end of a flexible tube that's guided down your throat and into your esophagus (the tube leading from you mouth to your stomach) to get a more detailed image of your heart. You are not awake for the procedure. Please see the instruction sheet given to you today. For further information please visit www.[email or web address].  Your physician has recommended that you have an ablation. Catheter ablation is a medical procedure used to treat some cardiac arrhythmias (irregular heartbeats). During catheter ablation, a long, thin, flexible tube is put into a blood vessel in your groin (upper thigh), or neck. This tube is called an ablation catheter. It is then guided to your heart through the blood vessel. Radio frequency waves destroy small areas of heart tissue where abnormal heartbeats may cause an arrhythmia to start. Please see the instruction sheet given to you today.    Any Other Special Instructions Will Be Listed Below (If Applicable).  If you need a refill on your cardiac medications before your next appointment, please call your pharmacy.    Cardiac Ablation Cardiac ablation is a procedure to destroy (ablate) some heart tissue that is sending bad signals. These bad signals cause problems in heart rhythm. The heart has many areas that make these signals. If there are problems in these areas, they can make the heart beat in a way that is not normal. Destroying some tissues can help make  the heart rhythm normal. Tell your doctor about:  Any allergies you have.  All medicines you are taking. These include vitamins, herbs, eye drops, creams, and over-the-counter medicines.  Any problems you or family members have had with medicines that make you fall asleep (anesthetics).  Any blood disorders you have.  Any surgeries you have had.  Any medical conditions you have, such as kidney failure.  Whether you are pregnant or may be pregnant. What are the risks? This is a safe procedure. But problems may occur, including:  Infection.  Bruising and bleeding.  Bleeding into the chest.  Stroke or blood clots.  Damage to nearby areas of your body.  Allergies to medicines or dyes.  The need for a pacemaker if the normal system is damaged.  Failure of the procedure to treat the problem. What happens before the procedure? Medicines Ask your doctor about:  Changing or stopping your normal medicines. This is important.  Taking aspirin and ibuprofen. Do not take these medicines unless your doctor tells you to take them.  Taking other medicines, vitamins, herbs, and supplements. General instructions  Follow instructions from your doctor about what you cannot eat or drink.  Plan to have someone take you home from the hospital or clinic.  If you will be going home right after the procedure, plan to have someone with you for 24 hours.  Ask your doctor what steps will be taken to prevent infection. What happens during the procedure?  An IV tube will be put into one of your veins.  You will be given a medicine to   help you relax.  The skin on your neck or groin will be numbed.  A cut (incision) will be made in your neck or groin. A needle will be put through your cut and into a large vein.  A tube (catheter) will be put into the needle. The tube will be moved to your heart.  Dye may be put through the tube. This helps your doctor see your heart.  Small devices  (electrodes) on the tube will send out signals.  A type of energy will be used to destroy some heart tissue.  The tube will be taken out.  Pressure will be held on your cut. This helps stop bleeding.  A bandage will be put over your cut. The exact procedure may vary among doctors and hospitals.   What happens after the procedure?  You will be watched until you leave the hospital or clinic. This includes checking your heart rate, breathing rate, oxygen, and blood pressure.  Your cut will be watched for bleeding. You will need to lie still for a few hours.  Do not drive for 24 hours or as long as your doctor tells you. Summary  Cardiac ablation is a procedure to destroy some heart tissue. This is done to treat heart rhythm problems.  Tell your doctor about any medical conditions you may have. Tell him or her about all medicines you are taking to treat them.  This is a safe procedure. But problems may occur. These include infection, bruising, bleeding, and damage to nearby areas of your body.  Follow what your doctor tells you about food and drink. You may also be told to change or stop some of your medicines.  After the procedure, do not drive for 24 hours or as long as your doctor tells you. This information is not intended to replace advice given to you by your health care provider. Make sure you discuss any questions you have with your health care provider. Document Revised: 05/13/2019 Document Reviewed: 05/13/2019 Elsevier Patient Education  2021 Elsevier Inc.       

## 2020-11-27 ENCOUNTER — Other Ambulatory Visit: Payer: Self-pay

## 2020-11-27 ENCOUNTER — Ambulatory Visit: Payer: 59 | Admitting: Internal Medicine

## 2020-11-27 ENCOUNTER — Telehealth: Payer: Self-pay | Admitting: Internal Medicine

## 2020-11-27 ENCOUNTER — Encounter: Payer: Self-pay | Admitting: Internal Medicine

## 2020-11-27 VITALS — BP 124/84 | HR 60 | Temp 97.8°F | Resp 16 | Ht 74.0 in | Wt 271.1 lb

## 2020-11-27 DIAGNOSIS — Z1159 Encounter for screening for other viral diseases: Secondary | ICD-10-CM

## 2020-11-27 DIAGNOSIS — R739 Hyperglycemia, unspecified: Secondary | ICD-10-CM

## 2020-11-27 DIAGNOSIS — E669 Obesity, unspecified: Secondary | ICD-10-CM

## 2020-11-27 DIAGNOSIS — I10 Essential (primary) hypertension: Secondary | ICD-10-CM | POA: Diagnosis not present

## 2020-11-27 DIAGNOSIS — Z114 Encounter for screening for human immunodeficiency virus [HIV]: Secondary | ICD-10-CM | POA: Diagnosis not present

## 2020-11-27 NOTE — Telephone Encounter (Signed)
PDMP okay, prescription sent 

## 2020-11-27 NOTE — Patient Instructions (Signed)
Calorie counting is a great idea [employer] is an APP that should help you.  Check the  blood pressure 2 or 3 times a month   BP GOAL is between 110/65 and  135/85. If it is consistently higher or lower, let me know    GO TO THE LAB : Get the blood work     GO TO THE FRONT DESK, PLEASE SCHEDULE YOUR APPOINTMENTS Come back for for a checkup in 4 months

## 2020-11-27 NOTE — Progress Notes (Signed)
Subjective:    Patient ID: Cameron Carey, male    DOB: June 05, 1956, 65 y.o.   MRN: [id number]  DOS:  11/27/2020 Type of visit - description: Follow-up  Today with talk about atrial fibrillation, obesity, weight loss, lower extremity edema.  He reports that if he stays in the same position for a while, he develops B ankle edema, decrease with leg elevation.  He is trying to eat low-salt.  Review of Systems Denies chest pain, no difficulty breathing at rest or walking however if he tries to walk a lot faster he develops mild DOE.   Past Medical History:  Diagnosis Date  . Abnormal stress echo   . Allergy   . Anxiety   . Aortic valve stenosis   . Arthropathy   . Atrial fibrillation (HCC)   . Brachial radiculitis   . Edema   . Enthesopathy of ankle   . Esophageal dysmotility   . Gout   . Hammertoe of second toe of right foot    corrected  . History of heart valve replacement   . HTN (hypertension)   . Hx of transient ischemic attack (TIA) 12/2012  . Hypercholesterolemia   . Insomnia   . Left ventricular hypertrophy   . Obesity (BMI 30-39.9)   . Orthostatic hypotension   . Syncope and collapse     Past Surgical History:  Procedure Laterality Date  . AORTIC VALVE REPLACEMENT  2016  . CARDIOVERSION  06/20/2015  . CARDIOVERSION N/A 10/03/2020   Procedure: CARDIOVERSION;  Surgeon: Wendall Stade, MD;  Location: [facility] ENDOSCOPY;  Service: Cardiovascular;  Laterality: N/A;  . HAMMER TOE SURGERY Right 2015  . MEDIAL PARTIAL KNEE REPLACEMENT Bilateral 2020   2 procedures done 1 month apart   . METATARSAL OSTEOTOMY Right 2015   R 2nd metatarsal  . NASAL SEPTUM SURGERY  1996  . RIGHT AND LEFT HEART CATH  02/13/2015  . TONSILLECTOMY    . UVULECTOMY      Allergies as of 11/27/2020      Reactions   Acetazolamide Other (See Comments)   Passed out   Bactrim [sulfamethoxazole-trimethoprim]    Statins Other (See Comments)   Other reaction(s): Unspecified      Medication List        Accurate as of November 27, 2020 11:59 PM. If you have any questions, ask your nurse or doctor.        STOP taking these medications   amLODipine 5 MG tablet Commonly known as: NORVASC Stopped by: Willow Ora, MD     TAKE these medications   allopurinol 100 MG tablet Commonly known as: ZYLOPRIM Take 2 tablets (200 mg total) by mouth daily.   atorvastatin 20 MG tablet Commonly known as: LIPITOR Take 20 mg by mouth daily.   fluocinolone 0.01 % cream Commonly known as: VANOS Apply 1 application topically daily as needed (dry skin).   hydrOXYzine 25 MG tablet Commonly known as: ATARAX/VISTARIL Take 25 mg by mouth 3 (three) times daily as needed for itching.   metoprolol succinate 100 MG 24 hr tablet Commonly known as: TOPROL-XL Take 150 mg by mouth daily.   Olmesartan-amLODIPine-HCTZ 40-5-25 MG Tabs Take 1 tablet by mouth daily.   rivaroxaban 20 MG Tabs tablet Commonly known as: XARELTO Take 1 tablet (20 mg total) by mouth daily with supper.   tadalafil 20 MG tablet Commonly known as: CIALIS Take 1 tablet (20 mg total) by mouth daily as needed for erectile dysfunction.   zolpidem 10  MG tablet Commonly known as: AMBIEN TAKE 1/2 TO 1 TABLET BY MOUTH AT BEDTIME AS NEEDED FOR SLEEP *INS MAX #15/25 DAYS*          Objective:   Physical Exam BP 124/84 (BP Location: Left Arm, Patient Position: Sitting, Cuff Size: Normal)   Pulse 60   Temp 97.8 F (36.6 C) (Oral)   Resp 16   Ht 6\' 2"  (1.88 m)   Wt 271 lb 2 oz (123 kg)   SpO2 96%   BMI 34.81 kg/m  General:   Well developed, NAD, BMI noted. HEENT:  Normocephalic . Face symmetric, atraumatic Lungs:  CTA B Normal respiratory effort, no intercostal retractions, no accessory muscle use. Heart: Irregularly irregular Lower extremities: Trace periankle edema bilaterally Skin: Not pale. Not jaundice Neurologic:  alert & oriented X3.  Speech normal, gait appropriate for age and unassisted Psych--  Cognition and  judgment appear intact.  Cooperative with normal attention span and concentration.  Behavior appropriate. No anxious or depressed appearing.      Assessment     Assessment (new patient 08/2020, referred by Dr. 09/2020) HTN High cholesterol CV: --AVR, bioprosthetic, 2018 in 2019. --Postop A. Fib >>> on  A. Fib again ~ 05-2020, --No CAD Gout  OSA : better after uvulectomy  (~ 2000s)   PLAN HTN: Currently on metoprolol, olmesartan-amlodipine-HCTZ (he is not taking amlodipine 5 mg ) BP is very good.  Check a BMP. Hyperglycemia: Mild, check a A1c. Obesity: Extensive discussion about the issue, he plans to start counting calories, excellent idea. Atrial fibrillation: Saw cardiology on 11/14/2020, treatment options discussed, they recommended EP study and ablation.  Was recommended to work on weight reduction. Snoring: Sleep study ordered by cardiology Preventive care reviewed, see separate documentation RTC 4 months  This visit occurred during the SARS-CoV-2 public health emergency.  Safety protocols were in place, including screening questions prior to the visit, additional usage of staff PPE, and extensive cleaning of exam room while observing appropriate contact time as indicated for disinfecting solutions.

## 2020-11-27 NOTE — Telephone Encounter (Signed)
Requesting: Ambien 10mg  Contract: Will get today at [facility]  UDS: None Last Visit: 08/25/2020 Next Visit: 11/27/2020 Last Refill: 10/30/2020 #15 and 0RF  Please Advise

## 2020-11-28 ENCOUNTER — Encounter: Payer: Self-pay | Admitting: Internal Medicine

## 2020-11-28 DIAGNOSIS — Z0000 Encounter for general adult medical examination without abnormal findings: Secondary | ICD-10-CM | POA: Insufficient documentation

## 2020-11-28 LAB — BASIC METABOLIC PANEL
BUN: 27 mg/dL — ABNORMAL HIGH (ref 6–23)
CO2: 24 mEq/L (ref 19–32)
Calcium: 9.8 mg/dL (ref 8.4–10.5)
Chloride: 100 mEq/L (ref 96–112)
Creatinine, Ser: 1.2 mg/dL (ref 0.40–1.50)
GFR: 63.79 mL/min (ref >60.00)
Glucose, Bld: 104 mg/dL — ABNORMAL HIGH (ref 70–99)
Potassium: 3.9 mEq/L (ref 3.5–5.1)
Sodium: 140 mEq/L (ref 135–145)

## 2020-11-28 LAB — HEMOGLOBIN A1C: Hgb A1c MFr Bld: 6.5 % (ref 4.6–6.5)

## 2020-11-28 LAB — HEPATITIS C ANTIBODY
Hepatitis C Ab: NONREACTIVE
SIGNAL TO CUT-OFF: 0.01 (ref <1.00)

## 2020-11-28 LAB — HIV ANTIBODY (ROUTINE TESTING W REFLEX): HIV 1&2 Ab, 4th Generation: NONREACTIVE

## 2020-11-28 NOTE — Assessment & Plan Note (Addendum)
We did not do a physical today but preventive care was reviewed   Td 2011, booster at the next opportunity Shingrix x2 COVID-vaccine x3.  Encouraged to proceed with booster today. Had a colonoscopy 06/20/2008 Had a colonoscopy 05/05/2019, 1 polyp reviewed, pathology: Tubular adenoma, negative for high-grade dysplasia.

## 2020-11-28 NOTE — Assessment & Plan Note (Addendum)
HTN: Currently on metoprolol, olmesartan-amlodipine-HCTZ (he is not taking amlodipine 5 mg ) BP is very good.  Check a BMP. Hyperglycemia: Mild, check a A1c. Obesity: Extensive discussion about the issue, he plans to start counting calories, excellent idea. Atrial fibrillation: Saw cardiology on 11/14/2020, treatment options discussed, they recommended EP study and ablation.  Was recommended to work on weight reduction. Snoring: Sleep study ordered by cardiology Preventive care reviewed, see separate documentation RTC 4 months

## 2020-12-07 ENCOUNTER — Telehealth: Payer: Self-pay | Admitting: Internal Medicine

## 2020-12-07 NOTE — Telephone Encounter (Signed)
Medication: allopurinol (ZYLOPRIM) 100 MG tablet [[id number]]     Has the patient contacted their pharmacy? no (If no, request that the patient contact the pharmacy for the refill.) (If yes, when and what did the pharmacy advise?)    Preferred Pharmacy (with phone number or street name): [id number] - [place], [place] - [address] AT [address]  [address] [place] [address]  Phone:  [phone]  Fax:  [phone]     Agent: Please be advised that RX refills may take up to 3 business days. We ask that you follow-up with your pharmacy.

## 2020-12-08 MED ORDER — ALLOPURINOL 100 MG PO TABS: 200.0000 mg | ORAL_TABLET | Freq: Every day | ORAL | 1 refills | Status: DC

## 2020-12-08 NOTE — Telephone Encounter (Signed)
Rx sent 

## 2020-12-28 ENCOUNTER — Other Ambulatory Visit: Payer: Self-pay | Admitting: Internal Medicine

## 2020-12-28 NOTE — Telephone Encounter (Signed)
[facility]- they confirmed that Pt did pick up a prescription for Ambien on 11/27/2020.

## 2020-12-28 NOTE — Telephone Encounter (Signed)
Prescription sent

## 2020-12-28 NOTE — Telephone Encounter (Signed)
PDMP is showing he has not picked up any of the prescription I sent, please call the pharmacy and clarify

## 2020-12-28 NOTE — Telephone Encounter (Signed)
Requesting: Ambien 10mg  Contract: 11/27/2020 UDS: None Last Visit: 11/27/2020 Next Visit: 03/30/2021 Last Refill: 11/27/2020 #15 and 01/27/2021  Please Advise

## 2021-01-31 ENCOUNTER — Telehealth: Payer: Self-pay | Admitting: Internal Medicine

## 2021-01-31 MED ORDER — AMLODIPINE BESYLATE 5 MG PO TABS: 5.0000 mg | ORAL_TABLET | Freq: Every day | ORAL | 1 refills | Status: DC

## 2021-01-31 MED ORDER — METOPROLOL SUCCINATE ER 100 MG PO TB24: 150.0000 mg | ORAL_TABLET | Freq: Every day | ORAL | 1 refills | Status: DC

## 2021-01-31 NOTE — Telephone Encounter (Signed)
Called pt and informed him to call his cardiologist for the Xarelto. He stated he understood and will call his cardiologist.

## 2021-01-31 NOTE — Telephone Encounter (Signed)
Please inform the Pt that I have refilled the metoprolol and amlodipine- he will need to contact Dr. Eden Emms (cardiology) who prescribes the Xarelto to switch the prescription to 90 days.

## 2021-01-31 NOTE — Telephone Encounter (Signed)
Patient would like a refill of these medications  amLODipine (NORVASC) 5 MG tablet and metoprolol succinate (TOPROL-XL) 100 MG 24 hr tablet . They were last prescribed by his old doctor, before he moved here. He would also like to know if he can get rivaroxaban (XARELTO) 20 MG TABS tablet prescribed for 3 months instead of the 1 month supply.He would like for them to be sent to  [id number] - [place], [place] - [address] AT [address] [facility] [address]  [address] [place] [address]  Phone:  [phone]  Fax:  [phone]

## 2021-02-01 ENCOUNTER — Encounter (HOSPITAL_COMMUNITY)
Admission: RE | Disposition: A | Discharge status: Home / Self Care | Payer: Self-pay | Source: Home / Self Care | Attending: Cardiovascular Disease

## 2021-02-01 ENCOUNTER — Ambulatory Visit (HOSPITAL_BASED_OUTPATIENT_CLINIC_OR_DEPARTMENT_OTHER): Payer: 59

## 2021-02-01 ENCOUNTER — Ambulatory Visit (HOSPITAL_COMMUNITY)
Admission: RE | Admit: 2021-02-01 | Discharge: 2021-02-01 | Disposition: A | Discharge status: Home / Self Care | Payer: 59 | Attending: Cardiovascular Disease | Admitting: Cardiovascular Disease

## 2021-02-01 ENCOUNTER — Ambulatory Visit (HOSPITAL_COMMUNITY): Payer: 59 | Admitting: Anesthesiology

## 2021-02-01 ENCOUNTER — Other Ambulatory Visit: Payer: Self-pay

## 2021-02-01 ENCOUNTER — Encounter (HOSPITAL_COMMUNITY): Payer: Self-pay | Admitting: Cardiovascular Disease

## 2021-02-01 DIAGNOSIS — Z7901 Long term (current) use of anticoagulants: Secondary | ICD-10-CM | POA: Insufficient documentation

## 2021-02-01 DIAGNOSIS — Z6834 Body mass index (BMI) 34.0-34.9, adult: Secondary | ICD-10-CM | POA: Diagnosis not present

## 2021-02-01 DIAGNOSIS — I361 Nonrheumatic tricuspid (valve) insufficiency: Secondary | ICD-10-CM

## 2021-02-01 DIAGNOSIS — I10 Essential (primary) hypertension: Secondary | ICD-10-CM | POA: Diagnosis not present

## 2021-02-01 DIAGNOSIS — R0683 Snoring: Secondary | ICD-10-CM | POA: Insufficient documentation

## 2021-02-01 DIAGNOSIS — Z79899 Other long term (current) drug therapy: Secondary | ICD-10-CM | POA: Diagnosis not present

## 2021-02-01 DIAGNOSIS — Z888 Allergy status to other drugs, medicaments and biological substances status: Secondary | ICD-10-CM | POA: Diagnosis not present

## 2021-02-01 DIAGNOSIS — I4819 Other persistent atrial fibrillation: Secondary | ICD-10-CM | POA: Insufficient documentation

## 2021-02-01 DIAGNOSIS — E7800 Pure hypercholesterolemia, unspecified: Secondary | ICD-10-CM | POA: Diagnosis not present

## 2021-02-01 DIAGNOSIS — Z881 Allergy status to other antibiotic agents status: Secondary | ICD-10-CM | POA: Insufficient documentation

## 2021-02-01 DIAGNOSIS — I340 Nonrheumatic mitral (valve) insufficiency: Secondary | ICD-10-CM | POA: Diagnosis not present

## 2021-02-01 DIAGNOSIS — I081 Rheumatic disorders of both mitral and tricuspid valves: Secondary | ICD-10-CM | POA: Diagnosis not present

## 2021-02-01 HISTORY — PX: TEE WITHOUT CARDIOVERSION: SHX5443

## 2021-02-01 LAB — ECHO TEE
AR max vel: 2.33 cm2
AV Area VTI: 2.22 cm2
AV Area mean vel: 2.18 cm2
AV Mean grad: 8.5 mmHg
AV Peak grad: 14.4 mmHg
Ao pk vel: 1.9 m/s

## 2021-02-01 SURGERY — ECHOCARDIOGRAM, TRANSESOPHAGEAL
Anesthesia: Monitor Anesthesia Care

## 2021-02-01 MED ORDER — PHENYLEPHRINE 40 MCG/ML (10ML) SYRINGE FOR IV PUSH (FOR BLOOD PRESSURE SUPPORT)
PREFILLED_SYRINGE | INTRAVENOUS | Status: DC | PRN
  Administered 2021-02-01: 80 ug via INTRAVENOUS
  Administered 2021-02-01: 120 ug via INTRAVENOUS
  Administered 2021-02-01: 80 ug via INTRAVENOUS
  Administered 2021-02-01: 120 ug via INTRAVENOUS

## 2021-02-01 MED ORDER — SODIUM CHLORIDE 0.9 % IV SOLN: INTRAVENOUS | Status: DC

## 2021-02-01 MED ORDER — PROPOFOL 500 MG/50ML IV EMUL
INTRAVENOUS | Status: DC | PRN
  Administered 2021-02-01: 125 ug/kg/min via INTRAVENOUS

## 2021-02-01 NOTE — Anesthesia Preprocedure Evaluation (Addendum)
Anesthesia Evaluation  Patient identified by MRN, date of birth, ID band Patient awake    Reviewed: Allergy & Precautions, NPO status , Patient's Chart, lab work & pertinent test results, reviewed documented beta blocker date and time   Airway Mallampati: III  TM Distance: >3 FB Neck ROM: Full    Dental no notable dental hx. (+) Teeth Intact, Dental Advisory Given   Pulmonary sleep apnea (now s/p uvulopalatoplasty) ,    Pulmonary exam normal breath sounds clear to auscultation       Cardiovascular hypertension, Pt. on medications and Pt. on home beta blockers +CHF (mildly reduced RV systolic fx)  Normal cardiovascular exam+ dysrhythmias (xarelto) Atrial Fibrillation + Valvular Problems/Murmurs (s/p AVR 2016)  Rhythm:Regular Rate:Normal  Echo 07/2020: 1. Left ventricular ejection fraction, by estimation, is 55 to 60%. The left ventricle has normal function. The left ventricle has no regional wall motion abnormalities. Left ventricular diastolic parameters are indeterminate.  2. Right ventricular systolic function is mildly reduced. The right ventricular size is moderately enlarged. There is normal pulmonary artery systolic pressure. The estimated right ventricular systolic pressure is  13.1 mmHg. D-shaped interventricular  septum suggestive of RV pressure/volume overload.  3. Right atrial size was mildly dilated.  4. The mitral valve is normal in structure. Trivial mitral valve regurgitation. No evidence of mitral stenosis. Moderate mitral annular calcification.  5. There is a bioprosthetic aortic valve. Mean gradient mildly elevated, 19 mmHg. No significant regurgitation.  6. The inferior vena cava is dilated in size with <50% respiratory variability, suggesting right atrial pressure of 15 mmHg.  7. The patient was in atrial fibrillation.    Neuro/Psych PSYCHIATRIC DISORDERS Anxiety negative neurological ROS      GI/Hepatic negative GI ROS, Neg liver ROS,   Endo/Other  BMI 34  Renal/GU negative Renal ROS  negative genitourinary   Musculoskeletal  (+) Arthritis , Osteoarthritis,    Abdominal (+) + obese,   Peds  Hematology negative hematology ROS (+)   Anesthesia Other Findings   Reproductive/Obstetrics negative OB ROS                            Anesthesia Physical Anesthesia Plan  ASA: 3  Anesthesia Plan: MAC   Post-op Pain Management:    Induction:   PONV Risk Score and Plan: 2 and Propofol infusion and TIVA  Airway Management Planned: Natural Airway and Simple Face Mask  Additional Equipment: None  Intra-op Plan:   Post-operative Plan:   Informed Consent: I have reviewed the patients History and Physical, chart, labs and discussed the procedure including the risks, benefits and alternatives for the proposed anesthesia with the patient or authorized representative who has indicated his/her understanding and acceptance.     Dental advisory given  Plan Discussed with: CRNA  Anesthesia Plan Comments:        Anesthesia Quick Evaluation

## 2021-02-01 NOTE — Anesthesia Postprocedure Evaluation (Signed)
Anesthesia Post Note  Patient: Cameron Carey  Procedure(s) Performed: TRANSESOPHAGEAL ECHOCARDIOGRAM (TEE)     Patient location during evaluation: PACU Anesthesia Type: MAC Level of consciousness: awake and alert Pain management: pain level controlled Vital Signs Assessment: post-procedure vital signs reviewed and stable Respiratory status: spontaneous breathing, nonlabored ventilation and respiratory function stable Cardiovascular status: blood pressure returned to baseline and stable Postop Assessment: no apparent nausea or vomiting Anesthetic complications: no   No notable events documented.  Last Vitals:  Vitals:   02/01/21 1043 02/01/21 1052  BP: (!) 80/48 114/62  Pulse: 80 81  Resp: 17 18  Temp: 36.5 C   SpO2: 98% 97%    Last Pain:  Vitals:   02/01/21 1100  TempSrc:   PainSc: 0-No pain                 Pervis Hocking

## 2021-02-01 NOTE — Anesthesia Procedure Notes (Signed)
Procedure Name: MAC Date/Time: 02/01/2021 10:10 AM Performed by: Griffin Dakin, CRNA Pre-anesthesia Checklist: Patient identified, Emergency Drugs available, Suction available, Patient being monitored and Timeout performed Patient Re-evaluated:Patient Re-evaluated prior to induction Oxygen Delivery Method: Nasal cannula Preoxygenation: Pre-oxygenation with 100% oxygen Induction Type: IV induction Placement Confirmation: positive ETCO2 and breath sounds checked- equal and bilateral Dental Injury: Teeth and Oropharynx as per pre-operative assessment

## 2021-02-01 NOTE — Transfer of Care (Signed)
Immediate Anesthesia Transfer of Care Note  Patient: Cameron Carey  Procedure(s) Performed: TRANSESOPHAGEAL ECHOCARDIOGRAM (TEE)  Patient Location: Endoscopy Unit  Anesthesia Type:MAC  Level of Consciousness: awake, alert  and oriented  Airway & Oxygen Therapy: Patient Spontanous Breathing  Post-op Assessment: Report given to RN and Post -op Vital signs reviewed and stable  Post vital signs: Reviewed and stable  Last Vitals:  Vitals Value Taken Time  BP 74/58 02/01/21 1045  Temp 36.5 C 02/01/21 1043  Pulse 41 02/01/21 1047  Resp 20 02/01/21 1047  SpO2 94 % 02/01/21 1047  Vitals shown include unvalidated device data.  Last Pain:  Vitals:   02/01/21 1043  TempSrc: Temporal  PainSc: 0-No pain         Complications: No notable events documented.

## 2021-02-01 NOTE — CV Procedure (Signed)
TEE: Propofol  EF 55% Normal appearing bioprosthetic AVR no PVL Severe bi atrial enlargement with smoke No LAA thrombus Redundant atrial septum no PFO No pericardial effusion  Normal aortic root Mild TR Mild MR   Patient with lots of secretions and soft pallate tissue making Procedure somewhat difficult but sats remained > 90% with  Frequent suctioning   Patient should be ok to proceed with ablation  See full report in [employer]   Charlton Haws MD [facility]

## 2021-02-01 NOTE — H&P (Signed)
Electrophysiology Office Note:     Date:  11/14/2020    ID:  Cameron Carey, DOB 1956-03-28, MRN [id number]   PCP:  Wanda Plump, MD       [facility] Cardiologist:  None  [facility] Electrophysiologist:  Lanier Prude, MD    Referring MD: Newman Nip, NP    Chief Complaint: Persistent atrial fibrillation   History of Present Illness:     Cameron Carey is a 65 y.o. male who presents for an evaluation of persistent atrial fibrillation at the request of Rudi Coco, NP. Their medical history includes aortic stenosis post aortic valve replacement in 2017, history of TIA, obesity, hypertension, hypercholesterolemia.  The patient was seen by Rudi Coco on October 17, 2020.  The patient's diagnosis of atrial fibrillation dates back to his valve replacement surgery performed on June 16, 2015 in [place], [place].  After surgery he required 3 cardioversions but then his atrial arrhythmias quieted until moving to [place].  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  He is followed by Dr. Eden Emms.  He is on Xarelto for stroke prophylaxis. While he is in atrial fibrillation he cannot appreciate any palpitations but does note a decreased exercise tolerance.  No syncope or presyncope.  No chest pain or shortness of breath.  Seen by Dr Lalla Brothers 11/14/20 and set up for TEE pre ablation to define anatomy and r/o LAA thrombus          Past Medical History:  Diagnosis Date   Abnormal stress echo     Allergy     Anxiety     Aortic valve stenosis     Arthropathy     Atrial fibrillation (HCC)     Brachial radiculitis     Edema     Enthesopathy of ankle     Esophageal dysmotility     Gout     Hammertoe of second toe of right foot      corrected   History of heart valve replacement     HTN (hypertension)     Hx of transient ischemic attack (TIA) 12/2012   Hypercholesterolemia     Insomnia     Left ventricular hypertrophy     Obesity (BMI 30-39.9)      Orthostatic hypotension     Syncope and collapse             Past Surgical History:  Procedure Laterality Date   AORTIC VALVE REPLACEMENT   2016   CARDIOVERSION   06/20/2015   CARDIOVERSION N/A 10/03/2020    Procedure: CARDIOVERSION;  Surgeon: Wendall Stade, MD;  Location: [facility];  Service: Cardiovascular;  Laterality: N/A;   HAMMER TOE SURGERY Right 2015   MEDIAL PARTIAL KNEE REPLACEMENT Bilateral 2020    2 procedures done 1 month apart    METATARSAL OSTEOTOMY Right 2015    R 2nd metatarsal   NASAL SEPTUM SURGERY   1996   RIGHT AND LEFT HEART CATH   02/13/2015   TONSILLECTOMY       UVULECTOMY          Current Medications: Active Medications      Current Meds  Medication Sig   allopurinol (ZYLOPRIM) 100 MG tablet Take 2 tablets (200 mg total) by mouth daily.   amLODipine (NORVASC) 5 MG tablet Take 1 tablet (5 mg total) by mouth daily.   atorvastatin (LIPITOR) 20 MG tablet Take 20 mg by mouth daily.   fluocinolone (  VANOS) 0.01 % cream Apply 1 application topically daily as needed (dry skin).   hydrOXYzine (ATARAX/VISTARIL) 25 MG tablet Take 25 mg by mouth 3 (three) times daily as needed for itching.   metoprolol succinate (TOPROL-XL) 100 MG 24 hr tablet Take 150 mg by mouth daily.   Olmesartan-amLODIPine-HCTZ 40-5-25 MG TABS Take 1 tablet by mouth daily.   rivaroxaban (XARELTO) 20 MG TABS tablet Take 1 tablet (20 mg total) by mouth daily with supper.   tadalafil (CIALIS) 20 MG tablet Take 1 tablet (20 mg total) by mouth daily as needed for erectile dysfunction.   zolpidem (AMBIEN) 10 MG tablet TAKE 1/2 TO 1 TABLET BY MOUTH AT BEDTIME AS NEEDED FOR SLEEP *INS MAX #15/25 DAYS*        Allergies:   Acetazolamide, Bactrim [sulfamethoxazole-trimethoprim], and Statins    Social History         Socioeconomic History   Marital status: Married      Spouse name: Not on file   Number of children: 3   Years of education: Not on file   Highest education level: Not on file   Occupational History   Occupation: HR director   Tobacco Use   Smoking status: Never Smoker   Smokeless tobacco: Never Used  Building services engineer Use: Never used  Substance and Sexual Activity   Alcohol use: Yes      Alcohol/week: 2.0 standard drinks      Types: 2 Standard drinks or equivalent per week      Comment: ~ 3 times a week   Drug use: Never   Sexual activity: Not on file  Other Topics Concern   Not on file  Social History Narrative    Moved to [facility] 06/2020     Social Determinants of Health    Financial Resource Strain: Not on file  Food Insecurity: Not on file  Transportation Needs: Not on file  Physical Activity: Not on file  Stress: Not on file  Social Connections: Not on file      Family History: The patient's family history includes Heart murmur in his father. There is no history of Colon cancer, Prostate cancer, or Diabetes.   ROS:   Please see the history of present illness.    All other systems reviewed and are negative.   EKGs/Labs/Other Studies Reviewed:     The following studies were reviewed today:   August 17, 2020 echo personally reviewed Left ventricular function normal, 55% Right ventricular function mildly reduced RVSP 33 mmHg Mildly dilated right atrium Trivial MR Bioprosthetic aortic valve with a mean gradient of 19 mmHg  normal atrial size   EKG:  The ekg ordered today demonstrates atrial fibrillation   Recent Labs: 08/28/2020: ALT 21; TSH 1.52 10/02/2020: BUN 26; Creatinine, Ser 1.22; Hemoglobin 13.8; Platelets 150; Potassium 3.0; Sodium 140  Recent Lipid Panel Labs (Brief)          Component Value Date/Time    CHOL 156 08/28/2020 0727    TRIG 177.0 (H) 08/28/2020 0727    HDL 45.20 08/28/2020 0727    CHOLHDL 3 08/28/2020 0727    VLDL 35.4 08/28/2020 0727    LDLCALC 76 08/28/2020 0727        Physical Exam:     VS:  BP 122/84   Pulse 71   Ht 6\' 2"  (1.88 m)   Wt 272 lb (123.4 kg)   SpO2 99%   BMI 34.92 kg/m  Wt Readings from Last 3 Encounters:  11/14/20 272 lb (123.4 kg)  10/17/20 270 lb 3.2 oz (122.6 kg)  10/03/20 265 lb (120.2 kg)      Affect appropriate Healthy:  appears stated age HEENT: normal Neck supple with no adenopathy JVP normal no bruits no thyromegaly Lungs clear with no wheezing and good diaphragmatic motion Heart:  S1/S2 no murmur, no rub, gallop or click PMI normal Abdomen: benighn, BS positve, no tenderness, no AAA no bruit.  No HSM or HJR Distal pulses intact with no bruits No edema Neuro non-focal Skin warm and dry No muscular weakness    ASSESSMENT:     1. Persistent atrial fibrillation (HCC)   2. Primary hypertension   3. Morbid obesity (HCC)     PLAN:     In order of problems listed above:   Persistent atrial fibrillation Controlled ventricular rates.  Symptomatic with decreased exercise tolerance.  During today's visit we discussed the treatment options available for his atrial fibrillation including continued conservative management with rate control and stroke prophylaxis, rhythm control using antiarrhythmic therapy and rhythm control using ablation.  I agree with Darvin Neighbours assessment that his PR and QTC precludes safe use of class Ic and class III antiarrhythmic drugs.  I think he is too young for upfront use of amiodarone.  Given the persistent nature of his atrial fibrillation I do not think he is a candidate for dronedarone.  Given the above, I think ablation would be the patient's best option to restore normal rhythm.  I discussed the ablation procedure in detail with the patient during today's visit including the risks, expected recovery time and expected efficacy.  He will continue his Xarelto uninterrupted around the time of the procedure.   Risk, benefits, and alternatives to EP study and radiofrequency ablation for afib were also discussed in detail today. These risks include but are not limited to stroke, bleeding, vascular damage,  tamponade, perforation, damage to the esophagus, lungs, and other structures, pulmonary vein stenosis, worsening renal function, and death. The patient understands these risk and wishes to proceed.  We will therefore proceed with catheter ablation at the next available time.  Carto, ICE, anesthesia are requested for the procedure.  Will also obtain transesophageal echocardiogram prior to the procedure to exclude LAA thrombus and further evaluate atrial anatomy.   2.  Morbid obesity BMI 34.  We discussed the link between obesity and ablation outcomes in detail during today's visit.  The patient will aggressively work on weight reduction between now and the time of his ablation.  The goal would be for him to lose 10 to 20 pounds prior to the ablation which he thinks is feasible.   3.  Snoring, increased neck circumference, daytime sleepiness I suspect sleep apnea.  We discussed the link between untreated sleep apnea and atrial fibrillation ablation outcomes.  I will order a home sleep study for him today.      Charlton Haws MD [facility]

## 2021-02-01 NOTE — Progress Notes (Signed)
  Echocardiogram Echocardiogram Transesophageal has been performed.  Cameron Carey 02/01/2021, 10:49 AM

## 2021-02-01 NOTE — Anesthesia Preprocedure Evaluation (Addendum)
Anesthesia Evaluation  Patient identified by MRN, date of birth, ID band Patient awake    Reviewed: Allergy & Precautions, H&P , NPO status , Patient's Chart, lab work & pertinent test results, reviewed documented beta blocker date and time   Airway Mallampati: III  TM Distance: >3 FB Neck ROM: Full    Dental no notable dental hx. (+) Teeth Intact, Dental Advisory Given   Pulmonary neg pulmonary ROS,    Pulmonary exam normal breath sounds clear to auscultation       Cardiovascular Exercise Tolerance: Good hypertension, Pt. on medications and Pt. on home beta blockers + dysrhythmias Atrial Fibrillation  Rhythm:Irregular Rate:Normal     Neuro/Psych Anxiety negative neurological ROS  negative psych ROS   GI/Hepatic negative GI ROS, Neg liver ROS,   Endo/Other  negative endocrine ROS  Renal/GU negative Renal ROS  negative genitourinary   Musculoskeletal  (+) Arthritis , Osteoarthritis,    Abdominal   Peds  Hematology negative hematology ROS (+)   Anesthesia Other Findings   Reproductive/Obstetrics negative OB ROS                            Anesthesia Physical Anesthesia Plan  ASA: 3  Anesthesia Plan: General   Post-op Pain Management:    Induction: Intravenous  PONV Risk Score and Plan: 3 and Ondansetron, Dexamethasone and Midazolam  Airway Management Planned: Oral ETT  Additional Equipment:   Intra-op Plan:   Post-operative Plan: Extubation in OR  Informed Consent: I have reviewed the patients History and Physical, chart, labs and discussed the procedure including the risks, benefits and alternatives for the proposed anesthesia with the patient or authorized representative who has indicated his/her understanding and acceptance.     Dental advisory given  Plan Discussed with: CRNA  Anesthesia Plan Comments:        Anesthesia Quick Evaluation

## 2021-02-02 ENCOUNTER — Ambulatory Visit (HOSPITAL_COMMUNITY): Payer: 59 | Admitting: Certified Registered"

## 2021-02-02 ENCOUNTER — Ambulatory Visit (HOSPITAL_COMMUNITY)
Admission: RE | Admit: 2021-02-02 | Discharge: 2021-02-02 | Disposition: A | Discharge status: Home / Self Care | Payer: 59 | Attending: Cardiology | Admitting: Cardiology

## 2021-02-02 ENCOUNTER — Ambulatory Visit (HOSPITAL_COMMUNITY)
Admission: RE | Disposition: A | Discharge status: Home / Self Care | Payer: 59 | Source: Home / Self Care | Attending: Cardiology

## 2021-02-02 ENCOUNTER — Encounter (HOSPITAL_COMMUNITY): Payer: Self-pay | Admitting: Cardiovascular Disease

## 2021-02-02 ENCOUNTER — Other Ambulatory Visit: Payer: Self-pay

## 2021-02-02 DIAGNOSIS — R0683 Snoring: Secondary | ICD-10-CM | POA: Diagnosis not present

## 2021-02-02 DIAGNOSIS — I4819 Other persistent atrial fibrillation: Secondary | ICD-10-CM

## 2021-02-02 DIAGNOSIS — Z881 Allergy status to other antibiotic agents status: Secondary | ICD-10-CM | POA: Insufficient documentation

## 2021-02-02 DIAGNOSIS — Z952 Presence of prosthetic heart valve: Secondary | ICD-10-CM | POA: Diagnosis not present

## 2021-02-02 DIAGNOSIS — I10 Essential (primary) hypertension: Secondary | ICD-10-CM | POA: Diagnosis not present

## 2021-02-02 DIAGNOSIS — I700 Atherosclerosis of aorta: Secondary | ICD-10-CM | POA: Insufficient documentation

## 2021-02-02 DIAGNOSIS — Z6834 Body mass index (BMI) 34.0-34.9, adult: Secondary | ICD-10-CM | POA: Diagnosis not present

## 2021-02-02 DIAGNOSIS — Z79899 Other long term (current) drug therapy: Secondary | ICD-10-CM | POA: Diagnosis not present

## 2021-02-02 DIAGNOSIS — Z7901 Long term (current) use of anticoagulants: Secondary | ICD-10-CM | POA: Insufficient documentation

## 2021-02-02 DIAGNOSIS — Z888 Allergy status to other drugs, medicaments and biological substances status: Secondary | ICD-10-CM | POA: Diagnosis not present

## 2021-02-02 HISTORY — PX: ATRIAL FIBRILLATION ABLATION: EP1191

## 2021-02-02 LAB — BASIC METABOLIC PANEL
Anion gap: 12 (ref 5–15)
BUN: 17 mg/dL (ref 8–23)
CO2: 28 mmol/L (ref 22–32)
Calcium: 9.5 mg/dL (ref 8.9–10.3)
Chloride: 97 mmol/L — ABNORMAL LOW (ref 98–111)
Creatinine, Ser: 1.14 mg/dL (ref 0.61–1.24)
GFR, Estimated: >60 mL/min (ref >60)
Glucose, Bld: 116 mg/dL — ABNORMAL HIGH (ref 70–99)
Potassium: 3.8 mmol/L (ref 3.5–5.1)
Sodium: 137 mmol/L (ref 135–145)

## 2021-02-02 LAB — CBC
HCT: 43 % (ref 39.0–52.0)
Hemoglobin: 14.6 g/dL (ref 13.0–17.0)
MCH: 31.6 pg (ref 26.0–34.0)
MCHC: 34 g/dL (ref 30.0–36.0)
MCV: 93.1 fL (ref 80.0–100.0)
Platelets: 176 10*3/uL (ref 150–400)
RBC: 4.62 MIL/uL (ref 4.22–5.81)
RDW: 13.2 % (ref 11.5–15.5)
WBC: 7.6 10*3/uL (ref 4.0–10.5)
nRBC: 0 % (ref 0.0–0.2)

## 2021-02-02 LAB — POCT ACTIVATED CLOTTING TIME
Activated Clotting Time: 335 seconds
Activated Clotting Time: 399 seconds
Activated Clotting Time: 317 seconds
Activated Clotting Time: 347 seconds

## 2021-02-02 SURGERY — ATRIAL FIBRILLATION ABLATION
Anesthesia: General

## 2021-02-02 MED ORDER — ROCURONIUM BROMIDE 10 MG/ML (PF) SYRINGE
PREFILLED_SYRINGE | INTRAVENOUS | Status: DC | PRN
  Administered 2021-02-02: 60 mg via INTRAVENOUS
  Administered 2021-02-02 (×2): 40 mg via INTRAVENOUS

## 2021-02-02 MED ORDER — ACETAMINOPHEN 325 MG PO TABS
650.0000 mg | ORAL_TABLET | ORAL | Status: DC | PRN
  Filled 2021-02-02: qty 2

## 2021-02-02 MED ORDER — HEPARIN (PORCINE) IN NACL 1000-0.9 UT/500ML-% IV SOLN
INTRAVENOUS | Status: DC | PRN
  Administered 2021-02-02 (×3): 500 mL

## 2021-02-02 MED ORDER — SUGAMMADEX SODIUM 200 MG/2ML IV SOLN
INTRAVENOUS | Status: DC | PRN
  Administered 2021-02-02: 200 mg via INTRAVENOUS

## 2021-02-02 MED ORDER — SODIUM CHLORIDE 0.9 % IV SOLN: 250.0000 mL | INTRAVENOUS | Status: DC | PRN

## 2021-02-02 MED ORDER — SODIUM CHLORIDE 0.9% FLUSH: 3.0000 mL | INTRAVENOUS | Status: DC | PRN

## 2021-02-02 MED ORDER — PROTAMINE SULFATE 10 MG/ML IV SOLN
INTRAVENOUS | Status: DC | PRN
  Administered 2021-02-02: 35 mg via INTRAVENOUS

## 2021-02-02 MED ORDER — ISOPROTERENOL HCL 0.2 MG/ML IJ SOLN
INTRAMUSCULAR | Status: AC
  Filled 2021-02-02: qty 5

## 2021-02-02 MED ORDER — ONDANSETRON HCL 4 MG/2ML IJ SOLN: 4.0000 mg | Freq: Four times a day (QID) | INTRAMUSCULAR | Status: DC | PRN

## 2021-02-02 MED ORDER — PHENYLEPHRINE HCL-NACL 20-0.9 MG/250ML-% IV SOLN
INTRAVENOUS | Status: DC | PRN
Start: 2021-02-02 — End: 2021-02-02
  Administered 2021-02-02: 50 ug/min via INTRAVENOUS
  Administered 2021-02-02: 25 ug/min via INTRAVENOUS

## 2021-02-02 MED ORDER — ISOPROTERENOL HCL 0.2 MG/ML IJ SOLN
INTRAVENOUS | Status: DC | PRN
  Administered 2021-02-02: 4 ug/min via INTRAVENOUS

## 2021-02-02 MED ORDER — SODIUM CHLORIDE 0.9% FLUSH: 3.0000 mL | Freq: Two times a day (BID) | INTRAVENOUS | Status: DC

## 2021-02-02 MED ORDER — FENTANYL CITRATE (PF) 100 MCG/2ML IJ SOLN
INTRAMUSCULAR | Status: DC | PRN
  Administered 2021-02-02: 100 ug via INTRAVENOUS
  Administered 2021-02-02 (×2): 25 ug via INTRAVENOUS

## 2021-02-02 MED ORDER — ACETAMINOPHEN 500 MG PO TABS
1000.0000 mg | ORAL_TABLET | Freq: Once | ORAL | Status: AC
  Administered 2021-02-02: 1000 mg via ORAL
  Filled 2021-02-02: qty 2

## 2021-02-02 MED ORDER — DEXAMETHASONE SODIUM PHOSPHATE 10 MG/ML IJ SOLN
INTRAMUSCULAR | Status: DC | PRN
  Administered 2021-02-02: 10 mg via INTRAVENOUS

## 2021-02-02 MED ORDER — RIVAROXABAN 20 MG PO TABS
20.0000 mg | ORAL_TABLET | Freq: Every day | ORAL | Status: AC
  Administered 2021-02-02: 20 mg via ORAL
  Filled 2021-02-02 (×3): qty 1

## 2021-02-02 MED ORDER — SODIUM CHLORIDE 0.9 % IV SOLN: INTRAVENOUS | Status: DC

## 2021-02-02 MED ORDER — LIDOCAINE 2% (20 MG/ML) 5 ML SYRINGE
INTRAMUSCULAR | Status: DC | PRN
  Administered 2021-02-02: 60 mg via INTRAVENOUS

## 2021-02-02 MED ORDER — PHENYLEPHRINE 40 MCG/ML (10ML) SYRINGE FOR IV PUSH (FOR BLOOD PRESSURE SUPPORT)
PREFILLED_SYRINGE | INTRAVENOUS | Status: DC | PRN
  Administered 2021-02-02: 80 ug via INTRAVENOUS

## 2021-02-02 MED ORDER — ONDANSETRON HCL 4 MG/2ML IJ SOLN
INTRAMUSCULAR | Status: DC | PRN
  Administered 2021-02-02: 4 mg via INTRAVENOUS

## 2021-02-02 MED ORDER — HEPARIN SODIUM (PORCINE) 1000 UNIT/ML IJ SOLN
INTRAMUSCULAR | Status: DC | PRN
  Administered 2021-02-02: 1000 [IU] via INTRAVENOUS

## 2021-02-02 MED ORDER — HEPARIN (PORCINE) IN NACL 1000-0.9 UT/500ML-% IV SOLN
INTRAVENOUS | Status: AC
  Filled 2021-02-02: qty 2000

## 2021-02-02 MED ORDER — MIDAZOLAM HCL 5 MG/5ML IJ SOLN
INTRAMUSCULAR | Status: DC | PRN
  Administered 2021-02-02: 2 mg via INTRAVENOUS

## 2021-02-02 MED ORDER — HEPARIN SODIUM (PORCINE) 1000 UNIT/ML IJ SOLN
INTRAMUSCULAR | Status: AC
  Filled 2021-02-02: qty 1

## 2021-02-02 MED ORDER — HEPARIN SODIUM (PORCINE) 1000 UNIT/ML IJ SOLN
INTRAMUSCULAR | Status: DC | PRN
  Administered 2021-02-02 (×2): 2000 [IU] via INTRAVENOUS
  Administered 2021-02-02: 18000 [IU] via INTRAVENOUS

## 2021-02-02 MED ORDER — PROPOFOL 10 MG/ML IV BOLUS
INTRAVENOUS | Status: DC | PRN
  Administered 2021-02-02: 30 mg via INTRAVENOUS
  Administered 2021-02-02: 140 mg via INTRAVENOUS

## 2021-02-02 SURGICAL SUPPLY — 21 items
BLANKET WARM UNDERBOD FULL ACC (MISCELLANEOUS) ×2 IMPLANT
CATH OCTARAY 2.0 F 3-3-3-3-3 (CATHETERS) ×2 IMPLANT
CATH S CIRCA THERM PROBE 10F (CATHETERS) ×2 IMPLANT
CATH SMTCH THERMOCOOL SF DF (CATHETERS) ×2 IMPLANT
CATH SOUNDSTAR ECO 8FR (CATHETERS) ×2 IMPLANT
CATH WEBSTER BI DIR CS D-F CRV (CATHETERS) ×2 IMPLANT
CLOSURE PERCLOSE PROSTYLE (VASCULAR PRODUCTS) ×6 IMPLANT
COVER SWIFTLINK CONNECTOR (BAG) ×2 IMPLANT
MAT PREVALON FULL STRYKER (MISCELLANEOUS) ×2 IMPLANT
PACK EP LATEX FREE (CUSTOM PROCEDURE TRAY) ×2
PACK EP LF (CUSTOM PROCEDURE TRAY) ×1 IMPLANT
PAD PRO RADIOLUCENT 2001M-C (PAD) ×2 IMPLANT
PATCH CARTO3 (PAD) ×2 IMPLANT
SHEATH AGILIS NXT 8.5F 71CM (SHEATH) ×2 IMPLANT
SHEATH BAYLIS TRANSSEPTAL 98CM (NEEDLE) ×2 IMPLANT
SHEATH CARTO VIZIGO SM CVD (SHEATH) IMPLANT
SHEATH INTRO SL0 8.5F 63 (SHEATH) ×2 IMPLANT
SHEATH PINNACLE 8F 10CM (SHEATH) ×4 IMPLANT
SHEATH PINNACLE 9F 10CM (SHEATH) ×2 IMPLANT
SHEATH PROBE COVER 6X72 (BAG) ×2 IMPLANT
TUBING SMART ABLATE COOLFLOW (TUBING) ×2 IMPLANT

## 2021-02-02 NOTE — Progress Notes (Signed)
Ambulated in hallway and to the bathroom to void tol well no bleeding noted before or after ambulation.  

## 2021-02-02 NOTE — Anesthesia Postprocedure Evaluation (Signed)
Anesthesia Post Note  Patient: Cameron Carey  Procedure(s) Performed: ATRIAL FIBRILLATION ABLATION     Patient location during evaluation: Cath Lab Anesthesia Type: General Level of consciousness: awake and alert Pain management: pain level controlled Vital Signs Assessment: post-procedure vital signs reviewed and stable Respiratory status: spontaneous breathing, nonlabored ventilation, respiratory function stable and patient connected to nasal cannula oxygen Cardiovascular status: blood pressure returned to baseline and stable Postop Assessment: no apparent nausea or vomiting Anesthetic complications: no   No notable events documented.  Last Vitals:  Vitals:   02/02/21 1120 02/02/21 1125  BP: 124/67 123/61  Pulse: (!) 58 (!) 56  Resp: 14 13  Temp:  36.6 C  SpO2: 93% 93%    Last Pain:  Vitals:   02/02/21 1125  TempSrc: Temporal  PainSc:                  Laiden Milles,W. EDMOND

## 2021-02-02 NOTE — Progress Notes (Signed)
Discharge instructions reviewed with pt and his wife. Both voice understanding. 

## 2021-02-02 NOTE — Discharge Instructions (Signed)
Post procedure care instructions No driving for 4 days. No lifting over 5 lbs for 1 week. No vigorous or sexual activity for 1 week. You may return to work/your usual activities on 02/10/21. Keep procedure site clean & dry. If you notice increased pain, swelling, bleeding or pus, call/return!  You may shower after 24 hours, but no soaking in baths/hot tubs/pools for 1 week.    You have an appointment set up with the [facility]ibrillation [facility].  Multiple studies have shown that being followed by a dedicated atrial fibrillation clinic in addition to the standard care you receive from your other physicians improves health. We believe that enrollment in the atrial fibrillation clinic will allow [place] to better care for you.   The phone number to the Atrial Fibrillation Clinic is [phone]. The clinic is staffed Monday through Friday from 8:30am to 5pm.  Parking Directions: The clinic is located in the Heart and Vascular Building connected to [facility]. 1)From [address] turn on to [address] and go to the 3rd entrance  (Heart and Vascular entrance) on the right. 2)Look to the right for Heart &Vascular Parking Garage. 3)A code for the entrance is required, for September is 4455.   4)Take the elevators to the 1st floor. Registration is in the room with the glass walls at the end of the hallway.  If you have any trouble parking or locating the clinic, please don't hesitate to call [phone].

## 2021-02-02 NOTE — H&P (Signed)
Electrophysiology Office Note:     Date:  11/14/2020    ID:  Cameron Carey, DOB 07-24-1955, MRN [id number]   PCP:  Wanda Plump, MD       [facility] Cardiologist:  None  [facility] Electrophysiologist:  Lanier Prude, MD    Referring MD: Newman Nip, NP    Chief Complaint: Persistent atrial fibrillation   History of Present Illness:     Cameron Carey is a 65 y.o. male who presents for an evaluation of persistent atrial fibrillation at the request of Rudi Coco, NP. Their medical history includes aortic stenosis post aortic valve replacement in 2017, history of TIA, obesity, hypertension, hypercholesterolemia.  The patient was seen by Rudi Coco on October 17, 2020.  The patient's diagnosis of atrial fibrillation dates back to his valve replacement surgery performed on June 16, 2015 in [place], [place].  After surgery he required 3 cardioversions but then his atrial arrhythmias quieted until moving to [place].  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  He is followed by Dr. Eden Emms.  He is on Xarelto for stroke prophylaxis. While he is in atrial fibrillation he cannot appreciate any palpitations but does note a decreased exercise tolerance.  No syncope or presyncope.  No chest pain or shortness of breath.         Past Medical History:  Diagnosis Date   Abnormal stress echo     Allergy     Anxiety     Aortic valve stenosis     Arthropathy     Atrial fibrillation (HCC)     Brachial radiculitis     Edema     Enthesopathy of ankle     Esophageal dysmotility     Gout     Hammertoe of second toe of right foot      corrected   History of heart valve replacement     HTN (hypertension)     Hx of transient ischemic attack (TIA) 12/2012   Hypercholesterolemia     Insomnia     Left ventricular hypertrophy     Obesity (BMI 30-39.9)     Orthostatic hypotension     Syncope and collapse             Past Surgical History:  Procedure Laterality  Date   AORTIC VALVE REPLACEMENT   2016   CARDIOVERSION   06/20/2015   CARDIOVERSION N/A 10/03/2020    Procedure: CARDIOVERSION;  Surgeon: Wendall Stade, MD;  [facility];  Service: Cardiovascular;  Laterality: N/A;   HAMMER TOE SURGERY Right 2015   MEDIAL PARTIAL KNEE REPLACEMENT Bilateral 2020    2 procedures done 1 month apart    METATARSAL OSTEOTOMY Right 2015    R 2nd metatarsal   NASAL SEPTUM SURGERY   1996   RIGHT AND LEFT HEART CATH   02/13/2015   TONSILLECTOMY       UVULECTOMY          Current Medications: Active Medications      Current Meds  Medication Sig   allopurinol (ZYLOPRIM) 100 MG tablet Take 2 tablets (200 mg total) by mouth daily.   amLODipine (NORVASC) 5 MG tablet Take 1 tablet (5 mg total) by mouth daily.   atorvastatin (LIPITOR) 20 MG tablet Take 20 mg by mouth daily.   fluocinolone (VANOS) 0.01 % cream Apply 1 application topically daily as needed (dry skin).   hydrOXYzine (ATARAX/VISTARIL) 25 MG tablet Take 25  mg by mouth 3 (three) times daily as needed for itching.   metoprolol succinate (TOPROL-XL) 100 MG 24 hr tablet Take 150 mg by mouth daily.   Olmesartan-amLODIPine-HCTZ 40-5-25 MG TABS Take 1 tablet by mouth daily.   rivaroxaban (XARELTO) 20 MG TABS tablet Take 1 tablet (20 mg total) by mouth daily with supper.   tadalafil (CIALIS) 20 MG tablet Take 1 tablet (20 mg total) by mouth daily as needed for erectile dysfunction.   zolpidem (AMBIEN) 10 MG tablet TAKE 1/2 TO 1 TABLET BY MOUTH AT BEDTIME AS NEEDED FOR SLEEP *INS MAX #15/25 DAYS*        Allergies:   Acetazolamide, Bactrim [sulfamethoxazole-trimethoprim], and Statins    Social History         Socioeconomic History   Marital status: Married      Spouse name: Not on file   Number of children: 3   Years of education: Not on file   Highest education level: Not on file  Occupational History   Occupation: HR director   Tobacco Use   Smoking status: Never Smoker   Smokeless  tobacco: Never Used  Building services engineer Use: Never used  Substance and Sexual Activity   Alcohol use: Yes      Alcohol/week: 2.0 standard drinks      Types: 2 Standard drinks or equivalent per week      Comment: ~ 3 times a week   Drug use: Never   Sexual activity: Not on file  Other Topics Concern   Not on file  Social History Narrative    Moved to [facility] 06/2020     Social Determinants of Health    Financial Resource Strain: Not on file  Food Insecurity: Not on file  Transportation Needs: Not on file  Physical Activity: Not on file  Stress: Not on file  Social Connections: Not on file      Family History: The patient's family history includes Heart murmur in his father. There is no history of Colon cancer, Prostate cancer, or Diabetes.   ROS:   Please see the history of present illness.    All other systems reviewed and are negative.   EKGs/Labs/Other Studies Reviewed:     The following studies were reviewed today:   August 17, 2020 echo personally reviewed Left ventricular function normal, 55% Right ventricular function mildly reduced RVSP 33 mmHg Mildly dilated right atrium Trivial MR Bioprosthetic aortic valve with a mean gradient of 19 mmHg  normal atrial size   EKG:  The ekg ordered today demonstrates atrial fibrillation   Recent Labs: 08/28/2020: ALT 21; TSH 1.52 10/02/2020: BUN 26; Creatinine, Ser 1.22; Hemoglobin 13.8; Platelets 150; Potassium 3.0; Sodium 140  Recent Lipid Panel Labs (Brief)          Component Value Date/Time    CHOL 156 08/28/2020 0727    TRIG 177.0 (H) 08/28/2020 0727    HDL 45.20 08/28/2020 0727    CHOLHDL 3 08/28/2020 0727    VLDL 35.4 08/28/2020 0727    LDLCALC 76 08/28/2020 0727        Physical Exam:     VS:  BP 122/84   Pulse 71   Ht 6\' 2"  (1.88 m)   Wt 272 lb (123.4 kg)   SpO2 99%   BMI 34.92 kg/m         Wt Readings from Last 3 Encounters:  11/14/20 272 lb (123.4 kg)  10/17/20 270 lb 3.2 oz (122.6 kg)  10/03/20 265 lb (120.2 kg)      GEN:  Well nourished, well developed in no acute distress.  Obese HEENT: Normal NECK: No JVD; No carotid bruits LYMPHATICS: No lymphadenopathy CARDIAC: Irregularly irregular, no murmurs, rubs, gallops RESPIRATORY:  Clear to auscultation without rales, wheezing or rhonchi  ABDOMEN: Soft, non-tender, non-distended MUSCULOSKELETAL:  No edema; No deformity  SKIN: Warm and dry NEUROLOGIC:  Alert and oriented x 3 PSYCHIATRIC:  Normal affect    ASSESSMENT:     1. Persistent atrial fibrillation (HCC)   2. Primary hypertension   3. Morbid obesity (HCC)     PLAN:     In order of problems listed above:   Persistent atrial fibrillation Controlled ventricular rates.  Symptomatic with decreased exercise tolerance.  During today's visit we discussed the treatment options available for his atrial fibrillation including continued conservative management with rate control and stroke prophylaxis, rhythm control using antiarrhythmic therapy and rhythm control using ablation.  I agree with Darvin Neighbours assessment that his PR and QTC precludes safe use of class Ic and class III antiarrhythmic drugs.  I think he is too young for upfront use of amiodarone.  Given the persistent nature of his atrial fibrillation I do not think he is a candidate for dronedarone.  Given the above, I think ablation would be the patient's best option to restore normal rhythm.  I discussed the ablation procedure in detail with the patient during today's visit including the risks, expected recovery time and expected efficacy.  He will continue his Xarelto uninterrupted around the time of the procedure.   Risk, benefits, and alternatives to EP study and radiofrequency ablation for afib were also discussed in detail today. These risks include but are not limited to stroke, bleeding, vascular damage, tamponade, perforation, damage to the esophagus, lungs, and other structures, pulmonary vein stenosis,  worsening renal function, and death. The patient understands these risk and wishes to proceed.  We will therefore proceed with catheter ablation at the next available time.  Carto, ICE, anesthesia are requested for the procedure.  Will also obtain transesophageal echocardiogram prior to the procedure to exclude LAA thrombus and further evaluate atrial anatomy.   2.  Morbid obesity BMI 34.  We discussed the link between obesity and ablation outcomes in detail during today's visit.  The patient will aggressively work on weight reduction between now and the time of his ablation.  The goal would be for him to lose 10 to 20 pounds prior to the ablation which he thinks is feasible.   3.  Snoring, increased neck circumference, daytime sleepiness I suspect sleep apnea.  We discussed the link between untreated sleep apnea and atrial fibrillation ablation outcomes.  I will order a home sleep study for him today.      --------------------------------------------------------------------------------  I have seen, examined the patient, and reviewed the above assessment and plan.    Plan for PVI and posterior wall ablation.  Lanier Prude, MD 02/02/2021 7:18 AM

## 2021-02-02 NOTE — Anesthesia Procedure Notes (Signed)
Procedure Name: Intubation Date/Time: 02/02/2021 7:50 AM Performed by: Lavell Luster, CRNA Pre-anesthesia Checklist: Patient identified, Emergency Drugs available, Suction available, Patient being monitored and Timeout performed Patient Re-evaluated:Patient Re-evaluated prior to induction Oxygen Delivery Method: Circle system utilized Preoxygenation: Pre-oxygenation with 100% oxygen Induction Type: IV induction Ventilation: Mask ventilation with difficulty, Oral airway inserted - appropriate to patient size and Two handed mask ventilation required Laryngoscope Size: Mac, 4 and Glidescope Grade View: Grade II Tube type: Oral Tube size: 7.5 mm Number of attempts: 1 Airway Equipment and Method: Stylet and Video-laryngoscopy Secured at: 23 cm Tube secured with: Tape Dental Injury: Teeth and Oropharynx as per pre-operative assessment  Difficulty Due To: Difficulty was anticipated Future Recommendations: Recommend- induction with short-acting agent, and alternative techniques readily available

## 2021-02-02 NOTE — Transfer of Care (Signed)
Immediate Anesthesia Transfer of Care Note  Patient: Cameron Carey  Procedure(s) Performed: ATRIAL FIBRILLATION ABLATION  Patient Location: Cath Lab  Anesthesia Type:General  Level of Consciousness: awake, alert  and oriented  Airway & Oxygen Therapy: Patient connected to nasal cannula oxygen  Post-op Assessment: Post -op Vital signs reviewed and stable  Post vital signs: stable  Last Vitals:  Vitals Value Taken Time  BP 103/56 02/02/21 1051  Temp    Pulse 65 02/02/21 1052  Resp 13 02/02/21 1052  SpO2 90 % 02/02/21 1052  Vitals shown include unvalidated device data.  Last Pain:  Vitals:   02/02/21 0627  TempSrc:   PainSc: 0-No pain         Complications: No notable events documented.

## 2021-02-05 MED FILL — Heparin Sod (Porcine)-NaCl IV Soln 1000 Unit/500ML-0.9%: INTRAVENOUS | Qty: 500 | Status: AC

## 2021-02-06 NOTE — Interval H&P Note (Signed)
History and Physical Interval Note:  02/06/2021 3:30 PM  Cameron Carey  has presented today for surgery, with the diagnosis of PRE AFIB ABLATION.  The various methods of treatment have been discussed with the patient and family. After consideration of risks, benefits and other options for treatment, the patient has consented to  Procedure(s): TRANSESOPHAGEAL ECHOCARDIOGRAM (TEE) (N/A) as a surgical intervention.  The patient's history has been reviewed, patient examined, no change in status, stable for surgery.  I have reviewed the patient's chart and labs.  Questions were answered to the patient's satisfaction.     Charlton Haws

## 2021-03-06 ENCOUNTER — Other Ambulatory Visit: Payer: Self-pay

## 2021-03-06 ENCOUNTER — Encounter (HOSPITAL_COMMUNITY): Payer: Self-pay | Admitting: Nurse Practitioner

## 2021-03-06 ENCOUNTER — Ambulatory Visit (HOSPITAL_COMMUNITY)
Admission: RE | Admit: 2021-03-06 | Discharge: 2021-03-06 | Disposition: A | Discharge status: Home / Self Care | Payer: 59 | Source: Ambulatory Visit | Attending: Nurse Practitioner | Admitting: Nurse Practitioner

## 2021-03-06 VITALS — BP 136/86 | HR 63 | Ht 74.0 in | Wt 263.8 lb

## 2021-03-06 DIAGNOSIS — E7800 Pure hypercholesterolemia, unspecified: Secondary | ICD-10-CM | POA: Insufficient documentation

## 2021-03-06 DIAGNOSIS — I4819 Other persistent atrial fibrillation: Secondary | ICD-10-CM | POA: Insufficient documentation

## 2021-03-06 DIAGNOSIS — D6869 Other thrombophilia: Secondary | ICD-10-CM | POA: Diagnosis not present

## 2021-03-06 DIAGNOSIS — Z6839 Body mass index (BMI) 39.0-39.9, adult: Secondary | ICD-10-CM | POA: Diagnosis not present

## 2021-03-06 DIAGNOSIS — E669 Obesity, unspecified: Secondary | ICD-10-CM | POA: Insufficient documentation

## 2021-03-06 DIAGNOSIS — Z7901 Long term (current) use of anticoagulants: Secondary | ICD-10-CM | POA: Insufficient documentation

## 2021-03-06 DIAGNOSIS — Z952 Presence of prosthetic heart valve: Secondary | ICD-10-CM | POA: Insufficient documentation

## 2021-03-06 DIAGNOSIS — I480 Paroxysmal atrial fibrillation: Secondary | ICD-10-CM | POA: Diagnosis not present

## 2021-03-06 DIAGNOSIS — I10 Essential (primary) hypertension: Secondary | ICD-10-CM | POA: Insufficient documentation

## 2021-03-06 DIAGNOSIS — Z79899 Other long term (current) drug therapy: Secondary | ICD-10-CM | POA: Diagnosis not present

## 2021-03-06 NOTE — Progress Notes (Signed)
Primary Care Physician: Cameron Plump, MD Referring Physician: Dr. Eden Carey  EP: Dr. Georges Carey is a 65 y.o. 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  He had new onset  afib s/p surgery and required CV x 3. No CAD prior to surgery by cath. 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   He was  advised to f/u here as he was moving at that time. He established with Dr. Eden Carey  and was scheduled for cardioversion, after being stared on xarelto 07/25/20 for a CHA2DS2VASc score of at least 3.   He had successful cardioversion but has had ERAF. He is here to discuss means of restoring SR. He is not symptomatic in afib but would like to restore SR.   F/u in afib clinic, 03/06/21. One month s/p ablation. He is feeling great. No afib to report. No groin or swallowing difficulties. He is back to his usual routine.   Today, he denies symptoms of palpitations, chest pain, shortness of breath, orthopnea, PND, lower extremity edema, dizziness, presyncope, syncope, or neurologic sequela. The patient is tolerating medications without difficulties and is otherwise without complaint today.   Past Medical History:  Diagnosis Date   Abnormal stress echo    Allergy    Anxiety    Aortic valve stenosis    Arthropathy    Atrial fibrillation (HCC)    Brachial radiculitis    Edema    Enthesopathy of ankle    Esophageal dysmotility    Gout    Hammertoe of second toe of right foot    corrected   History of heart valve replacement    HTN (hypertension)    Hx of transient ischemic attack (TIA) 12/2012   Hypercholesterolemia    Insomnia    Left ventricular hypertrophy    Obesity (BMI 30-39.9)    Orthostatic hypotension    Syncope and collapse    Past Surgical History:  Procedure Laterality Date   AORTIC VALVE REPLACEMENT  2016    ATRIAL FIBRILLATION ABLATION N/A 02/02/2021   Procedure: ATRIAL FIBRILLATION ABLATION;  Surgeon: Cameron Prude, MD;  [facility];  Service: Cardiovascular;  Laterality: N/A;   CARDIOVERSION  06/20/2015   CARDIOVERSION N/A 10/03/2020   Procedure: CARDIOVERSION;  Surgeon: Cameron Stade, MD;  [facility];  Service: Cardiovascular;  Laterality: N/A;   HAMMER TOE SURGERY Right 2015   MEDIAL PARTIAL KNEE REPLACEMENT Bilateral 2020   2 procedures done 1 month apart    METATARSAL OSTEOTOMY Right 2015   R 2nd metatarsal   NASAL SEPTUM SURGERY  1996   RIGHT AND LEFT HEART CATH  02/13/2015   TEE WITHOUT CARDIOVERSION N/A 02/01/2021   Procedure: TRANSESOPHAGEAL ECHOCARDIOGRAM (TEE);  Surgeon: Cameron Stade, MD;  Location: [facility] ENDOSCOPY;  Service: Cardiovascular;  Laterality: N/A;   TONSILLECTOMY     UVULECTOMY      Current Outpatient Medications  Medication Sig Dispense Refill   allopurinol (ZYLOPRIM) 100 MG tablet Take 2 tablets (200 mg total) by mouth daily. 180 tablet 1   amLODipine (NORVASC) 5 MG tablet Take 1 tablet (5 mg total) by mouth daily. 135 tablet 1   atorvastatin (LIPITOR) 20 MG tablet Take 20 mg by mouth daily.  fluocinolone (VANOS) 0.01 % cream Apply 1 application topically daily as needed (dry skin).     hydrOXYzine (ATARAX/VISTARIL) 25 MG tablet Take 25 mg by mouth at bedtime as needed for itching.     metoprolol succinate (TOPROL-XL) 100 MG 24 hr tablet Take 1.5 tablets (150 mg total) by mouth daily. Take with or immediately following a meal 135 tablet 1   Multiple Vitamin (MULTIVITAMIN WITH MINERALS) TABS tablet Take 1 tablet by mouth daily.     Olmesartan-amLODIPine-HCTZ 40-5-25 MG TABS Take 1 tablet by mouth daily.     rivaroxaban (XARELTO) 20 MG TABS tablet Take 1 tablet (20 mg total) by mouth daily with supper. 30 tablet 11   tadalafil (CIALIS) 20 MG tablet Take 1 tablet (20 mg total) by mouth daily as needed for erectile dysfunction. 10  tablet 5   zolpidem (AMBIEN) 10 MG tablet Take 0.5-1 tablets (5-10 mg total) by mouth at bedtime as needed for sleep. 30 tablet 1   No current facility-administered medications for this encounter.    Allergies  Allergen Reactions   Bactrim [Sulfamethoxazole-Trimethoprim]     Passed out    Social History   Socioeconomic History   Marital status: Married    Spouse name: Not on file   Number of children: 3   Years of education: Not on file   Highest education level: Not on file  Occupational History   Occupation: HR director   Tobacco Use   Smoking status: Never   Smokeless tobacco: Never  Vaping Use   Vaping Use: Never used  Substance and Sexual Activity   Alcohol use: Yes    Alcohol/week: 2.0 standard drinks    Types: 2 Standard drinks or equivalent per week    Comment: ~ 3 times a week   Drug use: Never   Sexual activity: Not on file  Other Topics Concern   Not on file  Social History Narrative   Moved to [facility] 06/2020    Social Determinants of Health   Financial Resource Strain: Not on file  Food Insecurity: Not on file  Transportation Needs: Not on file  Physical Activity: Not on file  Stress: Not on file  Social Connections: Not on file  Intimate Partner Violence: Not on file    Family History  Problem Relation Age of Onset   Heart murmur Father    Colon cancer Neg Hx    Prostate cancer Neg Hx    Diabetes Neg Hx     ROS- All systems are reviewed and negative except as per the HPI above  Physical Exam: Vitals:   03/06/21 0902  Height: 6\' 2"  (1.88 m)   Wt Readings from Last 3 Encounters:  02/02/21 120.2 kg  02/01/21 120.2 kg  11/27/20 123 kg    Labs: Lab Results  Component Value Date   NA 137 02/02/2021   K 3.8 02/02/2021   CL 97 (L) 02/02/2021   CO2 28 02/02/2021   GLUCOSE 116 (H) 02/02/2021   BUN 17 02/02/2021   CREATININE 1.14 02/02/2021   CALCIUM 9.5 02/02/2021   No results found for: INR Lab Results  Component Value Date   CHOL  156 08/28/2020   HDL 45.20 08/28/2020   LDLCALC 76 08/28/2020   TRIG 177.0 (H) 08/28/2020     GEN- The patient is well appearing, alert and oriented x 3 today.   Head- normocephalic, atraumatic Eyes-  Sclera clear, conjunctiva pink Ears- hearing intact Oropharynx- clear Neck- supple, no JVP Lymph- no cervical  lymphadenopathy Lungs- Clear to ausculation bilaterally, normal work of breathing Heart-  regular rate and rhythm, no murmurs, rubs or gallops, PMI not laterally displaced GI- soft, NT, ND, + BS Extremities- no clubbing, cyanosis, or edema MS- no significant deformity or atrophy Skin- no rash or lesion Psych- euthymic mood, full affect Neuro- strength and sensation are intact  EKG- NSR at 63 bpm, pr int 166 ms, qrs int 82 ms, qtc 433 ms     Echo -08/17/20- 1. Left ventricular ejection fraction, by estimation, is 55 to 60%. The  left ventricle has normal function. The left ventricle has no regional  wall motion abnormalities. Left ventricular diastolic parameters are  indeterminate.   2. Right ventricular systolic function is mildly reduced. The right  ventricular size is moderately enlarged. There is normal pulmonary artery  systolic pressure. The estimated right ventricular systolic pressure is  33.0 mmHg. D-shaped interventricular  septum suggestive of RV pressure/volume overload.   3. Right atrial size was mildly dilated.   4. The mitral valve is normal in structure. Trivial mitral valve  regurgitation. No evidence of mitral stenosis. Moderate mitral annular  calcification.   5. There is a bioprosthetic aortic valve. Mean gradient mildly elevated,  19 mmHg. No significant regurgitation.   6. The inferior vena cava is dilated in size with <50% respiratory  variability, suggesting right atrial pressure of 15 mmHg.   7. The patient was in atrial fibrillation.   FINDINGS   Left Ventricle: Left ventricular ejection fraction, by estimation, is 55  to 60%. The left  ventricle has normal function. The left ventricle has no  regional wall motion abnormalities. The left ventricular internal cavity  size was normal in size. There is   no left ventricular hypertrophy. Left ventricular diastolic parameters  are indeterminate.   Right Ventricle: The right ventricular size is moderately enlarged. No  increase in right ventricular wall thickness. Right ventricular systolic  function is mildly reduced. There is normal pulmonary artery systolic  pressure. The tricuspid regurgitant  velocity is 2.12 m/s, and with an assumed right atrial pressure of 15  mmHg, the estimated right ventricular systolic pressure is 33.0 mmHg.     Assessment and Plan: 1. Persistent  afib Quiet after valve surgery in 2016 until December of 2021 Successful cardioversion 10/03/20 but ERAF Now s/p one month following afib ablation  He is doing well maintaining  SR    2. CHA2DS2VASc score of 3 Continue  xarelto 20 mg daily  Do not interrupt xarelto for the full 3 month recovery period s/p ablation   3. AVR Per Dr. Eden Carey   4. HTN Stable   F/u with Dr. Lalla Brothers 11/22  Cameron Carey. [facility] [address] [place], [place] [address] [phone]

## 2021-03-30 ENCOUNTER — Ambulatory Visit: Payer: 59 | Admitting: Internal Medicine

## 2021-04-04 ENCOUNTER — Ambulatory Visit: Payer: 59 | Admitting: Internal Medicine

## 2021-04-13 ENCOUNTER — Other Ambulatory Visit: Payer: Self-pay

## 2021-04-13 ENCOUNTER — Ambulatory Visit (INDEPENDENT_AMBULATORY_CARE_PROVIDER_SITE_OTHER): Payer: 59 | Admitting: Internal Medicine

## 2021-04-13 VITALS — BP 126/84 | HR 64 | Temp 98.2°F | Resp 16 | Ht 74.0 in | Wt 268.1 lb

## 2021-04-13 DIAGNOSIS — R739 Hyperglycemia, unspecified: Secondary | ICD-10-CM

## 2021-04-13 DIAGNOSIS — Z125 Encounter for screening for malignant neoplasm of prostate: Secondary | ICD-10-CM | POA: Diagnosis not present

## 2021-04-13 DIAGNOSIS — Z23 Encounter for immunization: Secondary | ICD-10-CM | POA: Diagnosis not present

## 2021-04-13 DIAGNOSIS — E119 Type 2 diabetes mellitus without complications: Secondary | ICD-10-CM | POA: Diagnosis not present

## 2021-04-13 DIAGNOSIS — I10 Essential (primary) hypertension: Secondary | ICD-10-CM

## 2021-04-13 MED ORDER — RIVAROXABAN 20 MG PO TABS: 20.0000 mg | ORAL_TABLET | Freq: Every day | ORAL | 1 refills | Status: DC

## 2021-04-13 NOTE — Patient Instructions (Addendum)
Recommend to proceed with new covid booster at your pharmacy.    GO TO THE FRONT DESK, PLEASE SCHEDULE YOUR APPOINTMENTS Come back for  a physical in 4 months

## 2021-04-13 NOTE — Progress Notes (Signed)
Subjective:    Patient ID: Cameron Carey, male    DOB: [birth year], 65 y.o.   MRN: [id number]  DOS:  04/13/2021 Type of visit - description: Follow-up  Today we talk about diabetes, atrial fibrillation, vaccinations. He is feeling well. Denies chest pain, difficulty breathing. No edema or palpitations  Review of Systems See above   Past Medical History:  Diagnosis Date   Abnormal stress echo    Allergy    Anxiety    Aortic valve stenosis    Arthropathy    Atrial fibrillation (HCC)    Brachial radiculitis    Edema    Enthesopathy of ankle    Esophageal dysmotility    Gout    Hammertoe of second toe of right foot    corrected   History of heart valve replacement    HTN (hypertension)    Hx of transient ischemic attack (TIA) 12/2012   Hypercholesterolemia    Insomnia    Left ventricular hypertrophy    Obesity (BMI 30-39.9)    Orthostatic hypotension    Syncope and collapse     Past Surgical History:  Procedure Laterality Date   AORTIC VALVE REPLACEMENT  2016   ATRIAL FIBRILLATION ABLATION N/A 02/02/2021   Procedure: ATRIAL FIBRILLATION ABLATION;  Surgeon: Lanier Prude, MD;  [facility];  Service: Cardiovascular;  Laterality: N/A;   CARDIOVERSION  06/20/2015   CARDIOVERSION N/A 10/03/2020   Procedure: CARDIOVERSION;  Surgeon: Wendall Stade, MD;  Location: [facility];  Service: Cardiovascular;  Laterality: N/A;   HAMMER TOE SURGERY Right 2015   MEDIAL PARTIAL KNEE REPLACEMENT Bilateral 2020   2 procedures done 1 month apart    METATARSAL OSTEOTOMY Right 2015   R 2nd metatarsal   NASAL SEPTUM SURGERY  1996   RIGHT AND LEFT HEART CATH  02/13/2015   TEE WITHOUT CARDIOVERSION N/A 02/01/2021   Procedure: TRANSESOPHAGEAL ECHOCARDIOGRAM (TEE);  Surgeon: Wendall Stade, MD;  Location: [facility] ENDOSCOPY;  Service: Cardiovascular;  Laterality: N/A;   TONSILLECTOMY     UVULECTOMY      Allergies as of 04/13/2021       Reactions   Bactrim  [sulfamethoxazole-trimethoprim]    Passed out        Medication List        Accurate as of April 13, 2021 11:59 PM. If you have any questions, ask your nurse or doctor.          allopurinol 100 MG tablet Commonly known as: ZYLOPRIM Take 2 tablets (200 mg total) by mouth daily.   amLODipine 5 MG tablet Commonly known as: NORVASC Take 1 tablet (5 mg total) by mouth daily.   atorvastatin 20 MG tablet Commonly known as: LIPITOR Take 20 mg by mouth daily.   fluocinolone 0.01 % cream Commonly known as: VANOS Apply 1 application topically daily as needed (dry skin).   hydrOXYzine 25 MG tablet Commonly known as: ATARAX/VISTARIL Take 25 mg by mouth at bedtime as needed for itching.   metoprolol succinate 100 MG 24 hr tablet Commonly known as: TOPROL-XL Take 1.5 tablets (150 mg total) by mouth daily. Take with or immediately following a meal   multivitamin with minerals Tabs tablet Take 1 tablet by mouth daily.   Olmesartan-amLODIPine-HCTZ 40-5-25 MG Tabs Take 1 tablet by mouth daily.   rivaroxaban 20 MG Tabs tablet Commonly known as: XARELTO Take 1 tablet (20 mg total) by mouth daily with supper.   tadalafil 20 MG tablet Commonly known as:  CIALIS Take 1 tablet (20 mg total) by mouth daily as needed for erectile dysfunction.   zolpidem 10 MG tablet Commonly known as: AMBIEN Take 0.5-1 tablets (5-10 mg total) by mouth at bedtime as needed for sleep.           Objective:   Physical Exam BP 126/84 (BP Location: Left Arm, Patient Position: Sitting, Cuff Size: Normal)   Pulse 64   Temp 98.2 F (36.8 C) (Oral)   Resp 16   Ht 6\' 2"  (1.88 m)   Wt 268 lb 2 oz (121.6 kg)   SpO2 96%   BMI 34.43 kg/m  General:   Well developed, NAD, BMI noted. HEENT:  Normocephalic . Face symmetric, atraumatic Lungs:  CTA B Normal respiratory effort, no intercostal retractions, no accessory muscle use. Heart: RRR,  no murmur.  Lower extremities: trace pretibial edema  bilaterally  Skin: Not pale. Not jaundice Neurologic:  alert & oriented X3.  Speech normal, gait appropriate for age and unassisted Psych--  Cognition and judgment appear intact.  Cooperative with normal attention span and concentration.  Behavior appropriate. No anxious or depressed appearing.      Assessment    Assessment (new patient 08/2020, referred by Dr. 09/2020) DM: A1c 6.5 (June 2022) HTN High cholesterol Insomnia  CV: --AVR, bioprosthetic, 2018 in 2019. --Postop A. Fib >>> on  A. Fib again ~ 05-2020  --No CAD --Ablation 01-2021 Gout  OSA : better after uvulectomy  (~ 2000s)   PLAN Atrial fibrillation: Had an ablation 01-2021, still anticoagulated, refill Xarelto 90 days, last visit with cardiology 03/06/2021, no symptoms, she seems to be on regular rate and rhythm today. DM: Last A1c 6.5, A1c concept discussed.  Diet and exercise discussed.  Recheck A1c HTN: BP looks very good, continue olmesartan, amlodipine, HCTZ and metoprolol.  Last BMP okay. Insomnia: Controlled on Ambien Snoring: Sleep study was ordered by cardiology.   Preventive care: Reviewed RTC 4 to 6 months, CPX    This visit occurred during the SARS-CoV-2 public health emergency.  Safety protocols were in place, including screening questions prior to the visit, additional usage of staff PPE, and extensive cleaning of exam room while observing appropriate contact time as indicated for disinfecting solutions.

## 2021-04-14 LAB — PSA: PSA: 0.32 ng/mL (ref <=4.00)

## 2021-04-14 LAB — HEMOGLOBIN A1C
Hgb A1c MFr Bld: 5.9 % of total Hgb — ABNORMAL HIGH (ref <5.7)
Mean Plasma Glucose: 123 mg/dL
eAG (mmol/L): 6.8 mmol/L

## 2021-04-15 ENCOUNTER — Encounter: Payer: Self-pay | Admitting: Internal Medicine

## 2021-04-15 NOTE — Assessment & Plan Note (Signed)
Tdap today Flu shot today PNM 20 at the next opportunity COVID booster recommended Prostate cancer screening: Check PSA today.

## 2021-04-15 NOTE — Assessment & Plan Note (Signed)
Atrial fibrillation: Had an ablation 01-2021, still anticoagulated, refill Xarelto 90 days, last visit with cardiology 03/06/2021, no symptoms, she seems to be on regular rate and rhythm today. DM: Last A1c 6.5, A1c concept discussed.  Diet and exercise discussed.  Recheck A1c HTN: BP looks very good, continue olmesartan, amlodipine, HCTZ and metoprolol.  Last BMP okay. Insomnia: Controlled on Ambien Snoring: Sleep study was ordered by cardiology.   Preventive care: Reviewed RTC 4 to 6 months

## 2021-05-14 ENCOUNTER — Telehealth: Payer: Self-pay | Admitting: Internal Medicine

## 2021-05-14 NOTE — Telephone Encounter (Signed)
Requesting: Ambien 10mg   Contract: 11/27/2020 UDS: None Last Visit: 04/13/2021  Next Visit: 08/20/2021 Last Refill: 12/28/2020 #30 and 1rf  Please Advise

## 2021-05-14 NOTE — Telephone Encounter (Signed)
Pdmp ok, Rx sent (#30 tabs)

## 2021-05-15 ENCOUNTER — Ambulatory Visit: Payer: 59 | Admitting: Cardiology

## 2021-05-15 NOTE — Progress Notes (Deleted)
Electrophysiology Office Follow up Visit Note:    Date:  05/15/2021   ID:  Cameron Carey, DOB 12/01/1955, MRN [id number]  PCP:  Wanda Plump, MD  [facility] Cardiologist:  None  [facility] Electrophysiologist:  Lanier Prude, MD    Interval History:    Cameron Carey is a 65 y.o. male who presents for a follow up visit after an atrial fibrillation ablation on February 02, 2021.  During the ablation the pulmonary veins and posterior wall were isolated.  The patient saw Rudi Coco in follow-up on March 06, 2021.  He was feeling very well at that appointment without recurrence of his arrhythmia.  He is on Xarelto for stroke prophylaxis       Past Medical History:  Diagnosis Date   Abnormal stress echo    Allergy    Anxiety    Aortic valve stenosis    Arthropathy    Atrial fibrillation (HCC)    Brachial radiculitis    Edema    Enthesopathy of ankle    Esophageal dysmotility    Gout    Hammertoe of second toe of right foot    corrected   History of heart valve replacement    HTN (hypertension)    Hx of transient ischemic attack (TIA) 12/2012   Hypercholesterolemia    Insomnia    Left ventricular hypertrophy    Obesity (BMI 30-39.9)    Orthostatic hypotension    Syncope and collapse     Past Surgical History:  Procedure Laterality Date   AORTIC VALVE REPLACEMENT  2016   ATRIAL FIBRILLATION ABLATION N/A 02/02/2021   Procedure: ATRIAL FIBRILLATION ABLATION;  Surgeon: Lanier Prude, MD;  [facility];  Service: Cardiovascular;  Laterality: N/A;   CARDIOVERSION  06/20/2015   CARDIOVERSION N/A 10/03/2020   Procedure: CARDIOVERSION;  Surgeon: Wendall Stade, MD;  Location: [facility];  Service: Cardiovascular;  Laterality: N/A;   HAMMER TOE SURGERY Right 2015   MEDIAL PARTIAL KNEE REPLACEMENT Bilateral 2020   2 procedures done 1 month apart    METATARSAL OSTEOTOMY Right 2015   R 2nd metatarsal   NASAL SEPTUM SURGERY  1996   RIGHT  AND LEFT HEART CATH  02/13/2015   TEE WITHOUT CARDIOVERSION N/A 02/01/2021   Procedure: TRANSESOPHAGEAL ECHOCARDIOGRAM (TEE);  Surgeon: Wendall Stade, MD;  Location: [facility] ENDOSCOPY;  Service: Cardiovascular;  Laterality: N/A;   TONSILLECTOMY     UVULECTOMY      Current Medications: No outpatient medications have been marked as taking for the 05/15/21 encounter (Appointment) with Lanier Prude, MD.     Allergies:   Bactrim [sulfamethoxazole-trimethoprim]   Social History   Socioeconomic History   Marital status: Married    Spouse name: Not on file   Number of children: 3   Years of education: Not on file   Highest education level: Not on file  Occupational History   Occupation: HR director   Tobacco Use   Smoking status: Never   Smokeless tobacco: Never  Vaping Use   Vaping Use: Never used  Substance and Sexual Activity   Alcohol use: Yes    Alcohol/week: 2.0 standard drinks    Types: 2 Standard drinks or equivalent per week    Comment: ~ 3 times a week   Drug use: Never   Sexual activity: Not on file  Other Topics Concern   Not on file  Social History Narrative   Moved to [facility] 06/2020  Social Determinants of Health   Financial Resource Strain: Not on file  Food Insecurity: Not on file  Transportation Needs: Not on file  Physical Activity: Not on file  Stress: Not on file  Social Connections: Not on file     Family History: The patient's family history includes Heart murmur in his father. There is no history of Colon cancer, Prostate cancer, or Diabetes.  ROS:   Please see the history of present illness.    All other systems reviewed and are negative.  EKGs/Labs/Other Studies Reviewed:    The following studies were reviewed today:  Ablation records  EKG:  The ekg ordered today demonstrates ***  Recent Labs: 08/28/2020: ALT 21; TSH 1.52 02/02/2021: BUN 17; Creatinine, Ser 1.14; Hemoglobin 14.6; Platelets 176; Potassium 3.8; Sodium 137  Recent Lipid  Panel    Component Value Date/Time   CHOL 156 08/28/2020 0727   TRIG 177.0 (H) 08/28/2020 0727   HDL 45.20 08/28/2020 0727   CHOLHDL 3 08/28/2020 0727   VLDL 35.4 08/28/2020 0727   LDLCALC 76 08/28/2020 0727    Physical Exam:    VS:  There were no vitals taken for this visit.    Wt Readings from Last 3 Encounters:  04/13/21 268 lb 2 oz (121.6 kg)  03/06/21 263 lb 12.8 oz (119.7 kg)  02/02/21 265 lb (120.2 kg)     GEN: *** Well nourished, well developed in no acute distress HEENT: Normal NECK: No JVD; No carotid bruits LYMPHATICS: No lymphadenopathy CARDIAC: ***RRR, no murmurs, rubs, gallops RESPIRATORY:  Clear to auscultation without rales, wheezing or rhonchi  ABDOMEN: Soft, non-tender, non-distended MUSCULOSKELETAL:  No edema; No deformity  SKIN: Warm and dry NEUROLOGIC:  Alert and oriented x 3 PSYCHIATRIC:  Normal affect        ASSESSMENT:    1. Persistent atrial fibrillation (HCC)   2. Transient ischemic attack   3. Primary hypertension    PLAN:    In order of problems listed above:  Follow-up 9 months         Total time spent with patient today *** minutes. This includes reviewing records, evaluating the patient and coordinating care.   Medication Adjustments/Labs and Tests Ordered: Current medicines are reviewed at length with the patient today.  Concerns regarding medicines are outlined above.  No orders of the defined types were placed in this encounter.  No orders of the defined types were placed in this encounter.    Signed, Steffanie Dunn, MD, [facility], [facility] 05/15/2021 5:08 AM    Electrophysiology [facility]

## 2021-06-05 ENCOUNTER — Telehealth: Payer: Self-pay | Admitting: *Deleted

## 2021-06-05 NOTE — Telephone Encounter (Signed)
-----   Message from Reesa Chew, CMA sent at 06/05/2021 10:24 AM EST ----- No PA required for sleep study

## 2021-06-05 NOTE — Telephone Encounter (Signed)
Called and made the patient aware that he may proceed with the [facility] Sleep Study. PIN # [phone] provided to the patient. Patient made aware that he will be contacted after the test has been read with the results and any recommendations. Patient verbalized understanding and thanked me for the call.    I apologized to the pt for the delay on our end. Pt thanked me for the apology. Pt agreeable to plan of care and will do sleep study this week.

## 2021-06-12 NOTE — Telephone Encounter (Signed)
Patient contacted regarding [facility] Sleep Study. Informed patient that [facility] Sleep Study needs to be completed or returned within 2 weeks of today's date. Made patient aware that if the sleep study has not been done within this timeframe and the device has not been returned, that this will be handed over to billing per the signed waiver agreement. Patient verbalized understanding and thanked me for the call.   Pt states he will do the sleep study by the end of this week. Pt thanked me for the call.

## 2021-06-15 ENCOUNTER — Ambulatory Visit (HOSPITAL_BASED_OUTPATIENT_CLINIC_OR_DEPARTMENT_OTHER)
Admission: RE | Admit: 2021-06-15 | Discharge: 2021-06-15 | Disposition: A | Discharge status: Home / Self Care | Payer: 59 | Source: Ambulatory Visit | Attending: Emergency Medicine | Admitting: Emergency Medicine

## 2021-06-15 ENCOUNTER — Telehealth (HOSPITAL_BASED_OUTPATIENT_CLINIC_OR_DEPARTMENT_OTHER): Payer: Self-pay | Admitting: Emergency Medicine

## 2021-06-15 ENCOUNTER — Emergency Department (HOSPITAL_BASED_OUTPATIENT_CLINIC_OR_DEPARTMENT_OTHER)
Admission: EM | Admit: 2021-06-15 | Discharge: 2021-06-15 | Disposition: A | Discharge status: Home / Self Care | Payer: 59 | Attending: Emergency Medicine | Admitting: Emergency Medicine

## 2021-06-15 ENCOUNTER — Encounter (HOSPITAL_BASED_OUTPATIENT_CLINIC_OR_DEPARTMENT_OTHER): Payer: Self-pay

## 2021-06-15 ENCOUNTER — Other Ambulatory Visit: Payer: Self-pay

## 2021-06-15 DIAGNOSIS — M25572 Pain in left ankle and joints of left foot: Secondary | ICD-10-CM | POA: Insufficient documentation

## 2021-06-15 DIAGNOSIS — M25472 Effusion, left ankle: Secondary | ICD-10-CM | POA: Insufficient documentation

## 2021-06-15 DIAGNOSIS — I10 Essential (primary) hypertension: Secondary | ICD-10-CM | POA: Diagnosis not present

## 2021-06-15 DIAGNOSIS — M79662 Pain in left lower leg: Secondary | ICD-10-CM | POA: Insufficient documentation

## 2021-06-15 DIAGNOSIS — M7989 Other specified soft tissue disorders: Secondary | ICD-10-CM | POA: Insufficient documentation

## 2021-06-15 DIAGNOSIS — Z79899 Other long term (current) drug therapy: Secondary | ICD-10-CM | POA: Diagnosis not present

## 2021-06-15 MED ORDER — CEPHALEXIN 500 MG PO CAPS: 500.0000 mg | ORAL_CAPSULE | Freq: Four times a day (QID) | ORAL | 0 refills | Status: DC

## 2021-06-15 NOTE — ED Provider Notes (Signed)
[facility] EMERGENCY DEPT Provider Note   CSN: [id number] Arrival date & time: 06/15/21  7106     History Chief Complaint  Patient presents with   Leg Pain    Cameron Carey is a 65 y.o. male.  HPI  65 year old male with past medical history of A. fib, HTN, HLD, anticoagulated on Xarelto presents emergency department with left lower extremity swelling and redness.  Patient states he noticed slight discomfort last night.  The leg became swollen, red and warm.  He took medication, read Voltaren gel, elevated and iced.  This improved and however this morning the leg is still uncomfortable with slight increase swelling of the calf.  He states has been compliant with his Xarelto.  No other long traveling, risk factors or history of DVT.  No fever.  Past Medical History:  Diagnosis Date   Abnormal stress echo    Allergy    Anxiety    Aortic valve stenosis    Arthropathy    Atrial fibrillation (HCC)    Brachial radiculitis    Edema    Enthesopathy of ankle    Esophageal dysmotility    Gout    Hammertoe of second toe of right foot    corrected   History of heart valve replacement    HTN (hypertension)    Hx of transient ischemic attack (TIA) 12/2012   Hypercholesterolemia    Insomnia    Left ventricular hypertrophy    Obesity (BMI 30-39.9)    Orthostatic hypotension    Syncope and collapse     Patient Active Problem List   Diagnosis Date Noted   Annual physical exam 11/28/2020   Diabetes (HCC) 09/22/2020   PCP NOTES >>>>>>>>>>>>>>>>>>>>>>> 08/26/2020   Hypertension 08/26/2020   Nonrheumatic aortic (valve) stenosis 08/10/2020   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   Hyperlipidemia 08/10/2020   Atrial fibrillation (HCC) 08/10/2020   Gout 08/10/2020   Abnormal stress echo 08/10/2020   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   Hx of combined right and left heart catheterization 08/10/2020   History of heart valve  replacement 08/03/2020   Localized, primary osteoarthritis 12/18/2016   Hammer toe 08/31/2013   Transient ischemic attack 01/14/2013    Past Surgical History:  Procedure Laterality Date   AORTIC VALVE REPLACEMENT  2016   ATRIAL FIBRILLATION ABLATION N/A 02/02/2021   Procedure: ATRIAL FIBRILLATION ABLATION;  Surgeon: Lanier Prude, MD;  [facility];  Service: Cardiovascular;  Laterality: N/A;   CARDIOVERSION  06/20/2015   CARDIOVERSION N/A 10/03/2020   Procedure: CARDIOVERSION;  Surgeon: Wendall Stade, MD;  Location: [facility] ENDOSCOPY;  Service: Cardiovascular;  Laterality: N/A;   HAMMER TOE SURGERY Right 2015   MEDIAL PARTIAL KNEE REPLACEMENT Bilateral 2020   2 procedures done 1 month apart    METATARSAL OSTEOTOMY Right 2015   R 2nd metatarsal   NASAL SEPTUM SURGERY  1996   RIGHT AND LEFT HEART CATH  02/13/2015   TEE WITHOUT CARDIOVERSION N/A 02/01/2021   Procedure: TRANSESOPHAGEAL ECHOCARDIOGRAM (TEE);  Surgeon: Wendall Stade, MD;  Location: [facility] ENDOSCOPY;  Service: Cardiovascular;  Laterality: N/A;   TONSILLECTOMY     UVULECTOMY         Family History  Problem Relation Age of Onset   Heart murmur Father    Colon cancer Neg Hx    Prostate cancer Neg Hx    Diabetes Neg Hx     Social History   Tobacco Use   Smoking status: Never  Smokeless tobacco: Never  Vaping Use   Vaping Use: Never used  Substance Use Topics   Alcohol use: Yes    Alcohol/week: 2.0 standard drinks    Types: 2 Standard drinks or equivalent per week    Comment: ~ 3 times a week   Drug use: Never    Home Medications Prior to Admission medications   Medication Sig Start Date End Date Taking? Authorizing Provider  cephALEXin (KEFLEX) 500 MG capsule Take 1 capsule (500 mg total) by mouth 4 (four) times daily. 06/15/21  Yes Kimila Papaleo, Clabe Seal, DO  allopurinol (ZYLOPRIM) 100 MG tablet Take 2 tablets (200 mg total) by mouth daily. 12/28/20   Wanda Plump, MD  amLODipine (NORVASC) 5 MG  tablet Take 1 tablet (5 mg total) by mouth daily. 01/31/21   Wanda Plump, MD  atorvastatin (LIPITOR) 20 MG tablet Take 20 mg by mouth daily.    [provider]  fluocinolone (VANOS) 0.01 % cream Apply 1 application topically daily as needed (dry skin).    [provider]  hydrOXYzine (ATARAX/VISTARIL) 25 MG tablet Take 25 mg by mouth at bedtime as needed for itching.    [provider]  metoprolol succinate (TOPROL-XL) 100 MG 24 hr tablet Take 1.5 tablets (150 mg total) by mouth daily. Take with or immediately following a meal 01/31/21   Wanda Plump, MD  Multiple Vitamin (MULTIVITAMIN WITH MINERALS) TABS tablet Take 1 tablet by mouth daily.    [provider]  Olmesartan-amLODIPine-HCTZ 40-5-25 MG TABS Take 1 tablet by mouth daily. 06/12/20   [provider]  rivaroxaban (XARELTO) 20 MG TABS tablet Take 1 tablet (20 mg total) by mouth daily with supper. 04/13/21   Wanda Plump, MD  tadalafil (CIALIS) 20 MG tablet Take 1 tablet (20 mg total) by mouth daily as needed for erectile dysfunction. 09/19/20   Wanda Plump, MD  zolpidem (AMBIEN) 10 MG tablet TAKE 1/2-1 TABLET BY MOUTH AT BEDTIME AS NEEDED FOR SLEEP. 05/14/21   Wanda Plump, MD    Allergies    Bactrim [sulfamethoxazole-trimethoprim]  Review of Systems   Review of Systems  Constitutional:  Negative for chills and fever.  Respiratory:  Negative for shortness of breath.   Cardiovascular:  Negative for chest pain.  Gastrointestinal:  Negative for abdominal pain.  Musculoskeletal:  Negative for back pain.  Skin:        LLE redness   Physical Exam Updated Vital Signs BP 122/69 (BP Location: Right Arm)    Pulse 68    Temp 98.1 F (36.7 C) (Oral)    Resp 14    Ht 6\' 2"  (1.88 m)    Wt 117.9 kg    SpO2 98%    BMI 33.38 kg/m   Physical Exam Vitals and nursing note reviewed.  Constitutional:      Appearance: Normal appearance.  HENT:     Head: Normocephalic.     Mouth/Throat:     Mouth: Mucous  membranes are moist.  Cardiovascular:     Rate and Rhythm: Normal rate.  Pulmonary:     Effort: Pulmonary effort is normal. No respiratory distress.  Abdominal:     Palpations: Abdomen is soft.     Tenderness: There is no abdominal tenderness.  Musculoskeletal:     Comments: LLE redness and slight swelling/warmth of the left lower leg just above the lateral malleolus, calf and popliteal fossa is soft, negative homans sign  Skin:    General: Skin is  warm.  Neurological:     Mental Status: He is alert and oriented to person, place, and time. Mental status is at baseline.  Psychiatric:        Mood and Affect: Mood normal.    ED Results / Procedures / Treatments   Labs (all labs ordered are listed, but only abnormal results are displayed) Labs Reviewed - No data to display  EKG None  Radiology No results found.  Procedures Procedures   Medications Ordered in ED Medications - No data to display  ED Course  I have reviewed the triage vital signs and the nursing notes.  Pertinent labs & imaging results that were available during my care of the patient were reviewed by me and considered in my medical decision making (see chart for details).    MDM Rules/Calculators/A&P                          65 year old male presents emergency department with redness and swelling of the left lower extremity.  Vitals are stable on arrival.  Area look suspicious for cellulitis, he is otherwise been compliant with his Xarelto.  Left lower extremity is otherwise neurovascularly intact.  Ultrasound does not arrive for couple hours.  Plan for the patient to be discharged, come back for DVT study.  If this is negative he has been sent antibiotics for superficial cellulitis.  The patient and spouse understand the importance of coming back for the DVT study.  I will still be here to review the results.  Patient at this time appears safe and stable for discharge and will be treated as an outpatient.   Discharge plan and strict return to ED precautions discussed, patient verbalizes understanding and agreement.     Final Clinical Impression(s) / ED Diagnoses Final diagnoses:  None    Rx / DC Orders ED Discharge Orders          Ordered    cephALEXin (KEFLEX) 500 MG capsule  4 times daily        06/15/21 5400             Rozelle Logan, DO 06/15/21 0945

## 2021-06-15 NOTE — ED Provider Notes (Signed)
DVT study was negative.  Patient instructed to start his Keflex prescription for cellulitis and understands.   Rozelle Logan, DO 06/15/21 1353

## 2021-06-15 NOTE — ED Triage Notes (Signed)
Pt reports L ankle swelling and pain since yesterday. Denies trauma to area. PMS intact bilaterally. Pt with small red swollen area on L shin.

## 2021-06-15 NOTE — Discharge Instructions (Addendum)
Return to the radiology department at 1045 for an ultrasound of the lower extremity.  The results will be brought to the ER, if negative you may start the oral antibiotics that I have sent to [facility] for superficial cellulitis.

## 2021-06-15 NOTE — Telephone Encounter (Signed)
Pharmacy lost power so we had to reprint his Keflex prescription

## 2021-06-16 ENCOUNTER — Other Ambulatory Visit: Payer: Self-pay | Admitting: Internal Medicine

## 2021-06-19 ENCOUNTER — Other Ambulatory Visit: Payer: Self-pay | Admitting: Internal Medicine

## 2021-06-21 ENCOUNTER — Other Ambulatory Visit: Payer: Self-pay

## 2021-06-21 ENCOUNTER — Encounter: Payer: Self-pay | Admitting: Cardiology

## 2021-06-21 ENCOUNTER — Ambulatory Visit (INDEPENDENT_AMBULATORY_CARE_PROVIDER_SITE_OTHER): Payer: 59 | Admitting: Cardiology

## 2021-06-21 VITALS — BP 116/74 | HR 72 | Ht 74.0 in | Wt 275.0 lb

## 2021-06-21 DIAGNOSIS — I4819 Other persistent atrial fibrillation: Secondary | ICD-10-CM | POA: Diagnosis not present

## 2021-06-21 DIAGNOSIS — I10 Essential (primary) hypertension: Secondary | ICD-10-CM

## 2021-06-21 NOTE — Telephone Encounter (Signed)
I called today as I saw he was seen today by Dr. Lalla Brothers. I called to see if Dr. Lalla Brothers wants him to still proceed with sleep study Cameron Carey. Pt said yes, but he has to reload the app on his phone. Pt will try to do the study over the weekend. I did remind the pt that if not done it will be sent to billing. Pt agreed to do the study. Pt has been given the PIN # [id number] again.

## 2021-06-21 NOTE — Progress Notes (Signed)
Electrophysiology Office Follow up Visit Note:    Date:  06/21/2021   ID:  Cameron Carey, DOB [birth year], MRN [id number]  PCP:  Colon Branch, MD   [facility] Electrophysiologist:  Vickie Epley, MD    Interval History:    Cameron Carey is a 65 y.o. male who presents for a follow up visit after PVI on 02/02/2021. During the ablation the PV and posterior wall were ablated. He saw Butch Penny in [facility] on 03/06/2021 and was doing well without recurrence. He is on xarelto for stroke ppx.   Patient is done very well since the ablation without recurrence.  He is very pleased with the results.  He has follow-up with a primary care physician.     Past Medical History:  Diagnosis Date   Abnormal stress echo    Allergy    Anxiety    Aortic valve stenosis    Arthropathy    Atrial fibrillation (HCC)    Brachial radiculitis    Edema    Enthesopathy of ankle    Esophageal dysmotility    Gout    Hammertoe of second toe of right foot    corrected   History of heart valve replacement    HTN (hypertension)    Hx of transient ischemic attack (TIA) 12/2012   Hypercholesterolemia    Insomnia    Left ventricular hypertrophy    Obesity (BMI 30-39.9)    Orthostatic hypotension    Syncope and collapse     Past Surgical History:  Procedure Laterality Date   AORTIC VALVE REPLACEMENT  2016   ATRIAL FIBRILLATION ABLATION N/A 02/02/2021   Procedure: ATRIAL FIBRILLATION ABLATION;  Surgeon: Vickie Epley, MD;  Location: [facility];  Service: Cardiovascular;  Laterality: N/A;   CARDIOVERSION  06/20/2015   CARDIOVERSION N/A 10/03/2020   Procedure: CARDIOVERSION;  Surgeon: Josue Hector, MD;  Location: [facility];  Service: Cardiovascular;  Laterality: N/A;   HAMMER TOE SURGERY Right 2015   MEDIAL PARTIAL KNEE REPLACEMENT Bilateral 2020   2 procedures done 1 month apart    METATARSAL OSTEOTOMY Right 2015   R 2nd metatarsal   NASAL SEPTUM SURGERY  1996   RIGHT AND LEFT HEART  CATH  02/13/2015   TEE WITHOUT CARDIOVERSION N/A 02/01/2021   Procedure: TRANSESOPHAGEAL ECHOCARDIOGRAM (TEE);  Surgeon: Josue Hector, MD;  Location: [facility];  Service: Cardiovascular;  Laterality: N/A;   TONSILLECTOMY     UVULECTOMY      Current Medications: Current Meds  Medication Sig   allopurinol (ZYLOPRIM) 100 MG tablet TAKE 2 TABLETS BY MOUTH EVERY DAY   amLODipine (NORVASC) 5 MG tablet Take 1 tablet (5 mg total) by mouth daily.   atorvastatin (LIPITOR) 20 MG tablet TAKE 1 TABLET BY MOUTH EVERY DAY AS DIRECTED   cephALEXin (KEFLEX) 500 MG capsule Take 1 capsule (500 mg total) by mouth 4 (four) times daily.   fluocinolone (VANOS) 0.01 % cream Apply 1 application topically daily as needed (dry skin).   hydrOXYzine (ATARAX/VISTARIL) 25 MG tablet Take 25 mg by mouth at bedtime as needed for itching.   metoprolol succinate (TOPROL-XL) 100 MG 24 hr tablet TAKE 1 AND 1/2 TABLETS (150 MG TOTAL) BY MOUTH DAILY. TAKE WITH OR IMMEDIATELY FOLLOWING A MEAL   Multiple Vitamin (MULTIVITAMIN WITH MINERALS) TABS tablet Take 1 tablet by mouth daily.   Olmesartan-amLODIPine-HCTZ 40-5-25 MG TABS Take 1 tablet by mouth daily.   rivaroxaban (XARELTO) 20 MG TABS tablet Take 1  tablet (20 mg total) by mouth daily with supper.   tadalafil (CIALIS) 20 MG tablet Take 1 tablet (20 mg total) by mouth daily as needed for erectile dysfunction.   zolpidem (AMBIEN) 10 MG tablet TAKE 1/2-1 TABLET BY MOUTH AT BEDTIME AS NEEDED FOR SLEEP.     Allergies:   Bactrim [sulfamethoxazole-trimethoprim]   Social History   Socioeconomic History   Marital status: Married    Spouse name: Not on file   Number of children: 3   Years of education: Not on file   Highest education level: Not on file  Occupational History   Occupation: HR director   Tobacco Use   Smoking status: Never   Smokeless tobacco: Never  Vaping Use   Vaping Use: Never used  Substance and Sexual Activity   Alcohol use: Yes    Alcohol/week:  2.0 standard drinks    Types: 2 Standard drinks or equivalent per week    Comment: ~ 3 times a week   Drug use: Never   Sexual activity: Not on file  Other Topics Concern   Not on file  Social History Narrative   Moved to [facility] 06/2020    Social Determinants of Health   Financial Resource Strain: Not on file  Food Insecurity: Not on file  Transportation Needs: Not on file  Physical Activity: Not on file  Stress: Not on file  Social Connections: Not on file     Family History: The patient's family history includes Heart murmur in his father. There is no history of Colon cancer, Prostate cancer, or Diabetes.  ROS:   Please see the history of present illness.    All other systems reviewed and are negative.  EKGs/Labs/Other Studies Reviewed:    The following studies were reviewed today:   EKG:  The ekg ordered today demonstrates sinus rhythm.  Recent Labs: 08/28/2020: ALT 21; TSH 1.52 02/02/2021: BUN 17; Creatinine, Ser 1.14; Hemoglobin 14.6; Platelets 176; Potassium 3.8; Sodium 137  Recent Lipid Panel    Component Value Date/Time   CHOL 156 08/28/2020 0727   TRIG 177.0 (H) 08/28/2020 0727   HDL 45.20 08/28/2020 0727   CHOLHDL 3 08/28/2020 0727   VLDL 35.4 08/28/2020 0727   LDLCALC 76 08/28/2020 0727    Physical Exam:    VS:  BP 116/74    Pulse 72    Ht 6\' 2"  (1.88 m)    Wt 275 lb (124.7 kg)    SpO2 94%    BMI 35.31 kg/m     Wt Readings from Last 3 Encounters:  06/21/21 275 lb (124.7 kg)  06/15/21 260 lb (117.9 kg)  04/13/21 268 lb 2 oz (121.6 kg)     GEN:  Well nourished, well developed in no acute distress HEENT: Normal NECK: No JVD; No carotid bruits LYMPHATICS: No lymphadenopathy CARDIAC: RRR, no murmurs, rubs, gallops RESPIRATORY:  Clear to auscultation without rales, wheezing or rhonchi  ABDOMEN: Soft, non-tender, non-distended MUSCULOSKELETAL:  No edema; No deformity  SKIN: Warm and dry NEUROLOGIC:  Alert and oriented x 3 PSYCHIATRIC:  Normal affect         ASSESSMENT:    1. Primary hypertension   2. Persistent atrial fibrillation (HCC)   3. Morbid obesity (HCC)    PLAN:    In order of problems listed above:  #Persistent atrial fibrillation Maintaining sinus rhythm after his ablation.  On Xarelto for stroke prophylaxis.  I would continue this medication for now.  Also continue metoprolol.  #Hypertension Controlled today.  Continue current regimen.  #Obesity   F/u 1 year w APP       Medication Adjustments/Labs and Tests Ordered: Current medicines are reviewed at length with the patient today.  Concerns regarding medicines are outlined above.  No orders of the defined types were placed in this encounter.  No orders of the defined types were placed in this encounter.    Signed, Lars Mage, MD, [facility], [facility] 06/21/2021 10:33 AM    Electrophysiology [facility]

## 2021-06-21 NOTE — Patient Instructions (Addendum)
Medication Instructions:  °Your physician recommends that you continue on your current medications as directed. Please refer to the Current Medication list given to you today. °*If you need a refill on your cardiac medications before your next appointment, please call your pharmacy* ° °Lab Work: °None ordered. °If you have labs (blood work) drawn today and your tests are completely normal, you will receive your results only by: °MyChart Message (if you have MyChart) OR °A paper copy in the mail °If you have any lab test that is abnormal or we need to change your treatment, we will call you to review the results. ° °Testing/Procedures: °None ordered. ° °Follow-Up: °At [facility], you and your health needs are our priority.  As part of our continuing mission to provide you with exceptional heart care, we have created designated Provider Care Teams.  These Care Teams include your primary Cardiologist (physician) and Advanced Practice Providers (APPs -  Physician Assistants and Nurse Practitioners) who all work together to provide you with the care you need, when you need it. ° °Your next appointment:   °Your physician wants you to follow-up in: one year with one of the following Advanced Practice Providers on your designated Care Team:   °Renee Ursuy, PA-C °Michael "Andy" Tillery, PA-C °You will receive a reminder letter in the mail two months in advance. If you don't receive a letter, please call our office to schedule the follow-up appointment. ° °

## 2021-06-22 ENCOUNTER — Encounter (INDEPENDENT_AMBULATORY_CARE_PROVIDER_SITE_OTHER): Payer: 59 | Admitting: Cardiology

## 2021-06-22 DIAGNOSIS — G4733 Obstructive sleep apnea (adult) (pediatric): Secondary | ICD-10-CM

## 2021-06-29 NOTE — Telephone Encounter (Signed)
Looks like study has been completed. Will await for results to read by cardiologist.

## 2021-07-01 NOTE — Procedures (Signed)
° °  Sleep Study Report  Patient Information Study Date: 06/22/21 Patient Name: Cameron Carey Patient ID: [id number] Birth Date: July 26, 2055 Age: 66 Gender: Male BMI: 35.9 (W=271 lb, H=6' 1'') Neck Circ.: 17 '' Epworth: 1 Referring Physician: Lars Mage, MD  TEST DESCRIPTION: Home sleep apnea testing was completed using the WatchPat, a Type 1 device, utilizing peripheral arterial tonometry (PAT), chest movement, actigraphy, pulse oximetry, pulse rate, body position and snore. AHI was calculated with apnea and hypopnea using valid sleep time as the denominator. RDI includes apneas, hypopneas, and RERAs. The data acquired and the scoring of sleep and all associated events were performed in accordance with the recommended standards and specifications as outlined in the AASM Manual for the Scoring of Sleep and Associated Events 2.2.0 (2015).  FINDINGS: 1. Moderate Obstructive Sleep Apnea with AHI 22.2/hr. 2. No Central Sleep Apnea with pAHIc 1.2/hr. 3. Oxygen desaturations as low as 81%. 4. Mild snoring was present. O2 sats were < 88% for 24.6 min. 5. Total sleep time was 4 hrs and 57 min. 6. 0% of total sleep time was spent in REM sleep. 7. Normal sleep onset latency at 15 min 8. No REM sleep was present. 9. Total awakenings were 7.  DIAGNOSIS: Moderate Obstructive Sleep Apnea (G47.33) Nocturnal Hypoxemia  RECOMMENDATIONS: 1. Clinical correlation of these findings is necessary. The decision to treat obstructive sleep apnea (OSA) is usually based on the presence of apnea symptoms or the presence of associated medical conditions such as Hypertension, Congestive Heart Failure, Atrial Fibrillation or Obesity. The most common symptoms of OSA are snoring, gasping for breath while sleeping, daytime sleepiness and fatigue.  2. Initiating apnea therapy is recommended given the presence of symptoms and/or associated conditions. Recommend proceeding with one of the following:   a.  Auto-CPAP therapy with a pressure range of 5-20cm H2O.   b. An oral appliance (OA) that can be obtained from certain dentists with expertise in sleep medicine. These are primarily of use in non-obese patients with mild and moderate disease.   c. An ENT consultation which may be useful to look for specific causes of obstruction and possible treatment options.   d. If patient is intolerant to PAP therapy, consider referral to ENT for evaluation for hypoglossal nerve stimulator.  3. Close follow-up is necessary to ensure success with CPAP or oral appliance therapy for maximum benefit .  4. A follow-up oximetry study on CPAP is recommended to assess the adequacy of therapy and determine the need for supplemental oxygen or the potential need for Bi-level therapy. An arterial blood gas to determine the adequacy of baseline ventilation and oxygenation should also be considered.  5. Healthy sleep recommendations include: adequate nightly sleep (normal 7-9 hrs/night), avoidance of caffeine after noon and alcohol near bedtime, and maintaining a sleep environment that is cool, dark and quiet.  6. Weight loss for overweight patients is recommended. Even modest amounts of weight loss can significantly improve the severity of sleep apnea.  7. Snoring recommendations include: weight loss where appropriate, side sleeping, and avoidance of alcohol before bed.  8. Operation of motor vehicle should not be performed when sleepy.  Signature: Electronically Signed: 07/01/21 Fransico Him, MD; [facility]; [place], American Board of Sleep Medicine

## 2021-07-02 NOTE — Telephone Encounter (Signed)
Cameron Carey sleep study has been completed and read by Dr. Turner.  °

## 2021-07-09 ENCOUNTER — Ambulatory Visit: Payer: 59

## 2021-07-09 DIAGNOSIS — I4819 Other persistent atrial fibrillation: Secondary | ICD-10-CM

## 2021-07-18 NOTE — Progress Notes (Signed)
Spoke with patient and discussed sleep study results per Dr Radford Pax. He agrees to proceed with CPAP titration. He is aware that once we have approval from his insurance we will call him with a date and time for this to be done.

## 2021-07-20 ENCOUNTER — Encounter: Payer: Self-pay | Admitting: *Deleted

## 2021-07-20 ENCOUNTER — Telehealth: Payer: Self-pay | Admitting: *Deleted

## 2021-07-20 DIAGNOSIS — G4733 Obstructive sleep apnea (adult) (pediatric): Secondary | ICD-10-CM

## 2021-07-20 NOTE — Telephone Encounter (Signed)
-----   Message from Sueanne Margarita, MD sent at 07/01/2021  4:12 PM EST ----- Please let patient know that they have sleep apnea.  Recommend therapeutic CPAP titration for treatment of patient's sleep disordered breathing.  If unable to perform an in lab titration then initiate ResMed auto CPAP from 4 to 15cm H2O with heated humidity and mask of choice and overnight pulse ox on CPAP.

## 2021-07-20 NOTE — Telephone Encounter (Signed)
Juventino Slovak, CMA at 06/22/2021 11:59 PM    Spoke with patient and discussed sleep study results per Dr Radford Pax. He agrees to proceed with CPAP titration. He is aware that once we have approval from his insurance we will call him with a date and time for this to be done.

## 2021-07-20 NOTE — Telephone Encounter (Signed)
This encounter was created in error - please disregard.

## 2021-08-02 ENCOUNTER — Encounter: Payer: Self-pay | Admitting: Internal Medicine

## 2021-08-15 ENCOUNTER — Other Ambulatory Visit: Payer: Self-pay | Admitting: Internal Medicine

## 2021-08-20 ENCOUNTER — Encounter: Payer: Self-pay | Admitting: Internal Medicine

## 2021-08-20 ENCOUNTER — Ambulatory Visit (INDEPENDENT_AMBULATORY_CARE_PROVIDER_SITE_OTHER): Payer: 59 | Admitting: Internal Medicine

## 2021-08-20 VITALS — BP 136/80 | HR 64 | Temp 98.2°F | Resp 16 | Ht 74.0 in | Wt 265.5 lb

## 2021-08-20 DIAGNOSIS — Z0000 Encounter for general adult medical examination without abnormal findings: Secondary | ICD-10-CM

## 2021-08-20 DIAGNOSIS — Z23 Encounter for immunization: Secondary | ICD-10-CM | POA: Diagnosis not present

## 2021-08-20 DIAGNOSIS — E119 Type 2 diabetes mellitus without complications: Secondary | ICD-10-CM

## 2021-08-20 DIAGNOSIS — E876 Hypokalemia: Secondary | ICD-10-CM

## 2021-08-20 DIAGNOSIS — I10 Essential (primary) hypertension: Secondary | ICD-10-CM | POA: Diagnosis not present

## 2021-08-20 DIAGNOSIS — E785 Hyperlipidemia, unspecified: Secondary | ICD-10-CM | POA: Diagnosis not present

## 2021-08-20 DIAGNOSIS — M109 Gout, unspecified: Secondary | ICD-10-CM | POA: Diagnosis not present

## 2021-08-20 MED ORDER — FLUOCINOLONE ACETONIDE 0.01 % EX CREA: 1.0000 "application " | TOPICAL_CREAM | Freq: Every day | CUTANEOUS | 3 refills | Status: DC | PRN

## 2021-08-20 NOTE — Assessment & Plan Note (Signed)
Here for CPX DM: Diet controlled, A1c great.  Recheck on RTC High cholesterol: On atorvastatin, checking FLP HTN: BP today is very good, continue amlodipine, metoprolol, olmesartan-amlodipine-HCTZ.  Checking labs. Atrial fibrillation Last visit with cardiology 06/21/2021, noted to be maintaining sinus rhythm since ablation 01/2021.  No changes made. Gout:  on allopurinol, no attacks in years, check a uric acid OSA: Had a sleep study 05/2021, was recommend a CPAP Back pain: As described above, most likely a MSK issue, d/w pt x-rays or physical therapy, patient declines.  Will call if pain increases. Social: Just recently was laid off from his job, he remains well emotionally, plans to get another job. RTC 6 months

## 2021-08-20 NOTE — Progress Notes (Signed)
Subjective:    Patient ID: Cameron Carey, male    DOB: August 16, 1955, 66 y.o.   MRN: [id number]  DOS:  08/20/2021 Type of visit - description: CPX  Since the last office visit is doing well. Has no major concerns except for a 1 year history of mid right back.  Only with certain movements, no radiations, the pain does not go around the flank. Is not triggered by cough. Denies fever chills. No blood in the stool or in the urine.   Review of Systems  Other than above, a 14 point review of systems is negative      Past Medical History:  Diagnosis Date   Abnormal stress echo    Allergy    Anxiety    Aortic valve stenosis    Arthropathy    Atrial fibrillation (HCC)    Brachial radiculitis    Edema    Enthesopathy of ankle    Esophageal dysmotility    Gout    Hammertoe of second toe of right foot    corrected   History of heart valve replacement    HTN (hypertension)    Hx of transient ischemic attack (TIA) 12/2012   Hypercholesterolemia    Insomnia    Left ventricular hypertrophy    Obesity (BMI 30-39.9)    Orthostatic hypotension    Syncope and collapse     Past Surgical History:  Procedure Laterality Date   AORTIC VALVE REPLACEMENT  2016   ATRIAL FIBRILLATION ABLATION N/A 02/02/2021   Procedure: ATRIAL FIBRILLATION ABLATION;  Surgeon: Vickie Epley, MD;  Location: [facility];  Service: Cardiovascular;  Laterality: N/A;   CARDIOVERSION  06/20/2015   CARDIOVERSION N/A 10/03/2020   Procedure: CARDIOVERSION;  Surgeon: Josue Hector, MD;  Location: [facility];  Service: Cardiovascular;  Laterality: N/A;   HAMMER TOE SURGERY Right 2015   MEDIAL PARTIAL KNEE REPLACEMENT Bilateral 2020   2 procedures done 1 month apart    METATARSAL OSTEOTOMY Right 2015   R 2nd metatarsal   NASAL SEPTUM SURGERY  1996   RIGHT AND LEFT HEART CATH  02/13/2015   TEE WITHOUT CARDIOVERSION N/A 02/01/2021   Procedure: TRANSESOPHAGEAL ECHOCARDIOGRAM (TEE);  Surgeon: Josue Hector, MD;  Location: [facility] ENDOSCOPY;  Service: Cardiovascular;  Laterality: N/A;   TONSILLECTOMY     UVULECTOMY     Social History   Socioeconomic History   Marital status: Married    Spouse name: Not on file   Number of children: 3   Years of education: Not on file   Highest education level: Not on file  Occupational History   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, [employer] HR director  Tobacco Use   Smoking status: Never   Smokeless tobacco: Never  Vaping Use   Vaping Use: Never used  Substance and Sexual Activity   Alcohol use: Yes    Alcohol/week: 2.0 standard drinks    Types: 2 Standard drinks or equivalent per week    Comment: ~ 3 times a week   Drug use: Never   Sexual activity: Not on file  Other Topics Concern   Not on file  Social History Narrative   Moved to [facility] 06/2020    Social Determinants of Health   Financial Resource Strain: Not on file  Food Insecurity: Not on file  Transportation Needs: Not on file  Physical Activity: Not on file  Stress: Not on file  Social Connections: Not on file  Intimate Partner Violence: Not on file  Current Outpatient Medications  Medication Instructions   allopurinol (ZYLOPRIM) 100 MG tablet TAKE 2 TABLETS BY MOUTH EVERY DAY   amLODipine (NORVASC) 5 mg, Oral, Daily   atorvastatin (LIPITOR) 20 MG tablet TAKE 1 TABLET BY MOUTH EVERY DAY AS DIRECTED   fluocinolone (VANOS) Q000111Q % cream 1 application, Topical, Daily PRN   hydrOXYzine (ATARAX) 25 mg, Oral, At bedtime PRN   metoprolol succinate (TOPROL-XL) 100 MG 24 hr tablet TAKE 1 AND 1/2 TABLETS (150 MG TOTAL) BY MOUTH DAILY. TAKE WITH OR IMMEDIATELY FOLLOWING A MEAL   Multiple Vitamin (MULTIVITAMIN WITH MINERALS) TABS tablet 1 tablet, Oral, Daily   Olmesartan-amLODIPine-HCTZ 40-5-25 MG TABS 1 tablet, Oral, Daily   rivaroxaban (XARELTO) 20 mg, Oral, Daily with supper   tadalafil (CIALIS) 20 mg, Oral, Daily PRN   zolpidem (AMBIEN) 10 MG tablet TAKE 1/2-1 TABLET BY MOUTH AT BEDTIME  AS NEEDED FOR SLEEP.       Objective:   Physical Exam BP 136/80 (BP Location: Left Arm, Patient Position: Sitting, Cuff Size: Normal)    Pulse 64    Temp 98.2 F (36.8 C) (Oral)    Resp 16    Ht 6\' 2"  (1.88 m)    Wt 265 lb 8 oz (120.4 kg)    SpO2 97%    BMI 34.09 kg/m  General: Well developed, NAD, BMI noted Neck: No  thyromegaly  HEENT:  Normocephalic . Face symmetric, atraumatic Lungs:  CTA B Normal respiratory effort, no intercostal retractions, no accessory muscle use. Heart: RRR,  no murmur.  Abdomen:  Not distended, soft, non-tender. No rebound or rigidity. MSK: No TTP at the thoracic spine. DRE: Normal sphincter tone, no stools, prostate normal Lower extremities: no pretibial edema bilaterally  Skin: Exposed areas without rash. Not pale. Not jaundice Neurologic:  alert & oriented X3.  Speech normal, gait appropriate for age and unassisted Strength symmetric and appropriate for age.  Psych: Cognition and judgment appear intact.  Cooperative with normal attention span and concentration.  Behavior appropriate. No anxious or depressed appearing.     Assessment    Assessment (new patient 08/2020, referred by Dr. Johnsie Cancel) DM: A1c 6.5 (June 2022) HTN High cholesterol Insomnia  CV: --AVR, bioprosthetic, 2018 in [place]. --Postop A. Fib >>> on  A. Fib again ~ 05-2020  --No CAD --Ablation 01-2021 Gout  OSA : better after uvulectomy  (~ 2000s).  sleep study 05/2021, was Rx a CPAP Dry skin: rarely use topical steroids   PLAN Here for CPX DM: Diet controlled, A1c great.  Recheck on RTC High cholesterol: On atorvastatin, checking FLP HTN: BP today is very good, continue amlodipine, metoprolol, olmesartan-amlodipine-HCTZ.  Checking labs. Atrial fibrillation Last visit with cardiology 06/21/2021, noted to be maintaining sinus rhythm since ablation 01/2021.  No changes made. Gout:  on allopurinol, no attacks in years, check a uric acid OSA: Had a sleep study 05/2021,  was recommend a CPAP Back pain: As described above, most likely a MSK issue, d/w pt x-rays or physical therapy, patient declines.  Will call if pain increases. Social: Just recently was laid off from his job, he remains well emotionally, plans to get another job. RTC 6 months   -Tdap: 03-2021 - PNM 20: 08/20/2021 - COVID-vaccine booster recommended - Had a flu shot - Prostate cancer screening: DRE normal today, last PSA normal -CCS: Had a colonoscopy 06/20/2008 Had a colonoscopy 05/05/2019, 1 polyp reviewed, pathology: Tubular adenoma, negative for high-grade dysplasia. -Labs: CMP, FLP, CBC, uric acid -Lifestyle: Plans to go  to the gym more often, thinking about intermittent fasting which is a good option - ACP discussed    This visit occurred during the SARS-CoV-2 public health emergency.  Safety protocols were in place, including screening questions prior to the visit, additional usage of staff PPE, and extensive cleaning of exam room while observing appropriate contact time as indicated for disinfecting solutions.

## 2021-08-20 NOTE — Patient Instructions (Addendum)
Per our records you are due for your diabetic eye exam. Please contact your eye doctor to schedule an appointment. Please have them send copies of your office visit notes to [place]. Our fax number is [phone]. If you need a referral to an eye doctor please let us know.    Check the  blood pressure regularly BP GOAL is between 110/65 and  135/85. If it is consistently higher or lower, let me know  Consider COVID-vaccine booster  GO TO THE LAB : Get the blood work     [place], [place] back for   a checkup in 6 months     "Living will", "[place] of attorney": Advanced care planning  (If you already have a living will or healthcare power of attorney, please bring the copy to be scanned in your chart.)  Advance care planning is a process that supports adults in  understanding and sharing their preferences regarding future medical care.   The patient's preferences are recorded in documents called Advance Directives.    Advanced directives are completed (and can be modified at any time) while the patient is in full mental capacity.   The documentation should be available at all times to the patient, the family and the healthcare providers.  Bring in a copy to be scanned in your chart is an excellent idea and is recommended   This legal documents direct treatment decision making and/or appoint a surrogate to make the decision if the patient is not capable to do so.    Advance directives can be documented in many types of formats,  documents have names such as:  Lliving will  Durable power of attorney for healthcare (healthcare proxy or healthcare power of attorney)  Combined directives  Physician orders for life-sustaining treatment    More information at:  [email or web address]

## 2021-08-20 NOTE — Assessment & Plan Note (Signed)
-  Tdap: 03-2021 - PNM 20: 08/20/2021 - COVID-vaccine booster recommended - Had a flu shot - Prostate cancer screening: DRE normal today, last PSA normal -CCS: Had a colonoscopy 06/20/2008 Had a colonoscopy 05/05/2019, 1 polyp reviewed, pathology: Tubular adenoma, negative for high-grade dysplasia. -Labs: CMP, FLP, CBC, uric acid -Lifestyle: Plans to go to the gym more often, thinking about intermittent fasting which is a good option - ACP discussed

## 2021-08-21 LAB — CBC WITH DIFFERENTIAL/PLATELET
Basophils Absolute: 0.1 10*3/uL (ref 0.0–0.1)
Basophils Relative: 0.7 % (ref 0.0–3.0)
Eosinophils Absolute: 0.3 10*3/uL (ref 0.0–0.7)
Eosinophils Relative: 4.6 % (ref 0.0–5.0)
HCT: 44.1 % (ref 39.0–52.0)
Hemoglobin: 14.8 g/dL (ref 13.0–17.0)
Lymphocytes Relative: 28.5 % (ref 12.0–46.0)
Lymphs Abs: 2 10*3/uL (ref 0.7–4.0)
MCHC: 33.6 g/dL (ref 30.0–36.0)
MCV: 94.3 fl (ref 78.0–100.0)
Monocytes Absolute: 0.7 10*3/uL (ref 0.1–1.0)
Monocytes Relative: 9.8 % (ref 3.0–12.0)
Neutro Abs: 4 10*3/uL (ref 1.4–7.7)
Neutrophils Relative %: 56.4 % (ref 43.0–77.0)
Platelets: 184 10*3/uL (ref 150.0–400.0)
RBC: 4.68 Mil/uL (ref 4.22–5.81)
RDW: 13.1 % (ref 11.5–15.5)
WBC: 7.1 10*3/uL (ref 4.0–10.5)

## 2021-08-21 LAB — COMPREHENSIVE METABOLIC PANEL
ALT: 33 U/L (ref 0–53)
AST: 38 U/L — ABNORMAL HIGH (ref 0–37)
Albumin: 4.7 g/dL (ref 3.5–5.2)
Alkaline Phosphatase: 85 U/L (ref 39–117)
BUN: 19 mg/dL (ref 6–23)
CO2: 30 mEq/L (ref 19–32)
Calcium: 9.9 mg/dL (ref 8.4–10.5)
Chloride: 98 mEq/L (ref 96–112)
Creatinine, Ser: 1.11 mg/dL (ref 0.40–1.50)
GFR: 69.69 mL/min (ref >60.00)
Glucose, Bld: 102 mg/dL — ABNORMAL HIGH (ref 70–99)
Potassium: 3.3 mEq/L — ABNORMAL LOW (ref 3.5–5.1)
Sodium: 139 mEq/L (ref 135–145)
Total Bilirubin: 0.7 mg/dL (ref 0.2–1.2)
Total Protein: 7.7 g/dL (ref 6.0–8.3)

## 2021-08-21 LAB — LIPID PANEL
Cholesterol: 206 mg/dL — ABNORMAL HIGH (ref 0–200)
HDL: 57.8 mg/dL (ref >39.00)
NonHDL: 148.09
Total CHOL/HDL Ratio: 4
Triglycerides: 221 mg/dL — ABNORMAL HIGH (ref 0.0–149.0)
VLDL: 44.2 mg/dL — ABNORMAL HIGH (ref 0.0–40.0)

## 2021-08-21 LAB — LDL CHOLESTEROL, DIRECT: Direct LDL: 118 mg/dL

## 2021-08-21 LAB — URIC ACID: Uric Acid, Serum: 7.2 mg/dL (ref 4.0–7.8)

## 2021-08-21 MED ORDER — POTASSIUM CHLORIDE CRYS ER 10 MEQ PO TBCR: 10.0000 meq | EXTENDED_RELEASE_TABLET | Freq: Two times a day (BID) | ORAL | 0 refills | Status: DC

## 2021-08-21 MED ORDER — ATORVASTATIN CALCIUM 40 MG PO TABS: 40.0000 mg | ORAL_TABLET | Freq: Every day | ORAL | 0 refills | Status: DC

## 2021-08-21 NOTE — Addendum Note (Signed)
Addended byDamita Dunnings D on: 08/21/2021 04:48 PM   Modules accepted: Orders

## 2021-09-06 NOTE — Telephone Encounter (Signed)
DENIED PEER TO PEER #[id number] ? ?Will initiate APAP ? ?Upon patient request DME selection is [facility] ?Patient understands he will be contacted by [facility] to set up his cpap. ?Patient understands to call if [facility] does not contact him with new setup in a timely manner. ?Patient understands they will be called once confirmation has been received from [facility]/ that they have received their new machine to schedule 10 week follow up appointment. ?  ?[facility] notified of new cpap order  ?Please add to airview ?Patient was grateful for the call and thanked me.  ? ? ? ? ?

## 2021-09-18 ENCOUNTER — Telehealth: Payer: Self-pay

## 2021-09-18 MED ORDER — ZOLPIDEM TARTRATE 10 MG PO TABS: ORAL_TABLET | ORAL | 1 refills | Status: DC

## 2021-09-18 NOTE — Telephone Encounter (Signed)
PDMP okay, RF sent 

## 2021-09-18 NOTE — Telephone Encounter (Signed)
Requesting:Ambien 10mg   ?Contract: 11/27/2020 ?UDS: None ?Last Visit: 08/20/2021 ?Next Visit: 02/26/2022 ?Last Refill: 05/14/2021 #30 and 1RF ? ?Please Advise ? ?

## 2021-09-20 ENCOUNTER — Telehealth: Payer: Self-pay | Admitting: Internal Medicine

## 2021-09-20 MED ORDER — OLMESARTAN-AMLODIPINE-HCTZ 40-5-25 MG PO TABS: 1.0000 | ORAL_TABLET | Freq: Every day | ORAL | 1 refills | Status: DC

## 2021-09-20 NOTE — Telephone Encounter (Signed)
Nurse Assessment ?Nurse: Rolin Barry, RN, Levada Dy Date/Time Eilene Ghazi Time): 09/20/2021 2:00:35 PM ?Confirm and document reason for call. If ?symptomatic, describe symptoms. ?---Caller states that he recently lost about 20 to 25 ?pounds over the last month. He is calling today ?because his BP is 80/60 and he is experiencing ?dizziness. He is putting in a lot of exercise in his job. ?HR 71. ?Does the patient have any new or worsening ?symptoms? ---Yes ?Will a triage be completed? ---Yes ?Related visit to physician within the last 2 weeks? ---No ?Does the PT have any chronic conditions? (i.e. ?diabetes, asthma, this includes High risk factors for ?pregnancy, etc.) ?---Yes ?List chronic conditions. ---HTN currently on 3 b/p meds ?Is this a behavioral health or substance abuse call? ---No ?Guidelines ?Guideline Title Affirmed Question Affirmed Notes Nurse Date/Time (Eastern ?Time) ?Blood Pressure - Low [id number] Systolic BP < ?90 AND [id number] dizzy, ?lightheaded, or weak ?Rolin Barry, RN, Levada Dy 09/20/2021 2:02:21 ?PM ?PLEASE NOTE: All timestamps contained within this report are represented as [place] Standard Time. ?CONFIDENTIALTY NOTICE: This fax transmission is intended only for the addressee. It contains information that is legally privileged, confidential or ?otherwise protected from use or disclosure. If you are not the intended recipient, you are strictly prohibited from reviewing, disclosing, copying using ?or disseminating any of this information or taking any action in reliance on or regarding this information. If you have received this fax in error, please ?notify us immediately by telephone so that we can arrange for its return to [place]. Phone: [phone], Toll-Free: [phone], Fax: [phone] ?Page: 2 of 2 ?Call Id: [id number] ?Disp. Time (Eastern ?Time) Disposition Final User ?09/20/2021 1:59:32 PM Send to Urgent Queue Wynema Birch ?09/20/2021 2:06:22 PM 911 Outcome Documentation Deaton, RN, Levada Dy ?Reason: Caller  refused. ?09/20/2021 2:04:14 PM Call EMS 911 Now Yes Deaton, RN, Levada Dy ?Caller Disagree/Comply Disagree ?Caller Understands Yes ?PreDisposition Did not know what to do ?Care Advice Given Per Guideline ?CALL EMS 911 NOW: * Immediate medical attention is needed. You need to hang up and call 911 (or an ambulance). * Triager ?Discretion: I'll call you back in a few minutes to be sure you were able to reach them. CARE ADVICE given per Low Blood Pressure ?(Adult) guideline. ?Comments ?User: Saverio Danker, RN Date/Time Eilene Ghazi Time): 09/20/2021 2:07:36 PM ?Caller refused to go and be seen. Caller is wanting someone from the office to call him about changing his blood ?pressure medications. Called the backline, per directive and gave the information to [facility], she advised that she ?will inform the MD and they will call him back. ?Referrals ?GO TO FACILITY REFUSED ?

## 2021-09-20 NOTE — Telephone Encounter (Signed)
Pt stated he has recently lost 25 lbs, because of this his bp has been getting very low. 85/60. He thinks he may need to back off some of his bp medications. Transferred him to triage just in case.  ?

## 2021-09-20 NOTE — Telephone Encounter (Signed)
Rx sent. Ambien was refilled on 09/18/21. ?

## 2021-09-20 NOTE — Telephone Encounter (Signed)
Medication:  ?Olmesartan-amLODIPine-HCTZ 40-5-25 MG TABS [[id number]]  ?zolpidem (AMBIEN) 10 MG tablet [[id number]]  ? ?Has the patient contacted their pharmacy? No. ?(If no, request that the patient contact the pharmacy for the refill.) ?(If yes, when and what did the pharmacy advise?) ? ?  ? ?Preferred Pharmacy (with phone number or street name):  ?[id number] - [place], [place] - [address] AT [facility] [address] [address]  ?[address]  [place] [address]  ?Phone:  [phone]  Fax:  [phone] ?  ? ?Agent: Please be advised that RX refills may take up to 3 business days. We ask that you follow-up with your pharmacy.  ?

## 2021-09-20 NOTE — Telephone Encounter (Signed)
The nurse triage called our office back stating patient denied going to the ER , I spoke with pt he stated he was only dizzy for  a few seconds, denied wanting to go to urgent care or ER today on the phone again , patient is scheduled to see PCP tomorrow at 9:20 am , advised him if sx occur again to go to Urgent care or ER .  ?

## 2021-09-21 ENCOUNTER — Ambulatory Visit (INDEPENDENT_AMBULATORY_CARE_PROVIDER_SITE_OTHER): Payer: Self-pay | Admitting: Internal Medicine

## 2021-09-21 VITALS — Temp 98.0°F | Resp 18 | Ht 74.0 in | Wt 256.6 lb

## 2021-09-21 DIAGNOSIS — I10 Essential (primary) hypertension: Secondary | ICD-10-CM

## 2021-09-21 MED ORDER — OLMESARTAN MEDOXOMIL-HCTZ 40-12.5 MG PO TABS: 1.0000 | ORAL_TABLET | Freq: Every day | ORAL | 3 refills | Status: DC

## 2021-09-21 NOTE — Progress Notes (Signed)
? ?Subjective:  ? ? Patient ID: Cameron Carey, male    DOB: Sep 28, 1955, 66 y.o.   MRN: [id number] ? ?DOS:  09/21/2021 ?Type of visit - description: Acute ? ?For few months already,  he has changed his diet, eating healthy and losing weight. ?About 10 days ago he started a new job in [employer], he is extremely active 5 days a week. ? ?He is concerned because his BP has dropped often times to the 80s/60. ?Low BP has been noted  at home, before and after exercise . ? 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 ?Denies chest pain, lower extremity edema or palpitation. ?Occasionally when he gets up he gets slightly dizzy. ? ? ?Wt Readings from Last 3 Encounters:  ?09/21/21 256 lb 9.6 oz (116.4 kg)  ?08/20/21 265 lb 8 oz (120.4 kg)  ?06/21/21 275 lb (124.7 kg)  ? ?BP Readings from Last 3 Encounters:  ?08/20/21 136/80  ?06/21/21 116/74  ?06/15/21 114/68  ? ? ?Review of Systems ?See above  ? ?Past Medical History:  ?Diagnosis Date  ? Abnormal stress echo   ? Allergy   ? Anxiety   ? Aortic valve stenosis   ? Arthropathy   ? Atrial fibrillation ([place])   ? Brachial radiculitis   ? Edema   ? Enthesopathy of ankle   ? Esophageal dysmotility   ? Gout   ? Hammertoe of second toe of right foot   ? corrected  ? History of heart valve replacement   ? HTN (hypertension)   ? Hx of transient ischemic attack (TIA) 12/2012  ? Hypercholesterolemia   ? Insomnia   ? Left ventricular hypertrophy   ? Obesity (BMI 30-39.9)   ? Orthostatic hypotension   ? Syncope and collapse   ? ? ?Past Surgical History:  ?Procedure Laterality Date  ? AORTIC VALVE REPLACEMENT  2016  ? ATRIAL FIBRILLATION ABLATION N/A 02/02/2021  ? Procedure: ATRIAL FIBRILLATION ABLATION;  Surgeon: Vickie Epley, MD;  Location: [facility];  Service: Cardiovascular;  Laterality: N/A;  ? CARDIOVERSION  06/20/2015  ? CARDIOVERSION N/A 10/03/2020  ? Procedure: CARDIOVERSION;  Surgeon: Josue Hector, MD;  Location: [facility] ENDOSCOPY;  Service: Cardiovascular;   Laterality: N/A;  ? HAMMER TOE SURGERY Right 2015  ? MEDIAL PARTIAL KNEE REPLACEMENT Bilateral 2020  ? 2 procedures done 1 month apart   ? METATARSAL OSTEOTOMY Right 2015  ? R 2nd metatarsal  ? NASAL SEPTUM SURGERY  1996  ? RIGHT AND LEFT HEART CATH  02/13/2015  ? TEE WITHOUT CARDIOVERSION N/A 02/01/2021  ? Procedure: TRANSESOPHAGEAL ECHOCARDIOGRAM (TEE);  Surgeon: Josue Hector, MD;  Location: [facility] ENDOSCOPY;  Service: Cardiovascular;  Laterality: N/A;  ? TONSILLECTOMY    ? UVULECTOMY    ? ? ?Current Outpatient Medications  ?Medication Instructions  ? allopurinol (ZYLOPRIM) 100 MG tablet TAKE 2 TABLETS BY MOUTH EVERY DAY  ? amLODipine (NORVASC) 5 mg, Oral, Daily  ? atorvastatin (LIPITOR) 40 mg, Oral, Daily at bedtime  ? fluocinolone (VANOS) [id number] % cream 1 application., Topical, Daily PRN  ? hydrOXYzine (ATARAX) 25 mg, Oral, At bedtime PRN  ? metoprolol succinate (TOPROL-XL) 100 MG 24 hr tablet TAKE 1 AND 1/2 TABLETS (150 MG TOTAL) BY MOUTH DAILY. TAKE WITH OR IMMEDIATELY FOLLOWING A MEAL  ? Multiple Vitamin (MULTIVITAMIN WITH MINERALS) TABS tablet 1 tablet, Oral, Daily  ? olmesartan-hydrochlorothiazide (BENICAR HCT) 40-12.5 MG tablet 1 tablet, Oral, Daily  ? rivaroxaban (XARELTO) 20 mg, Oral, Daily with  supper  ? tadalafil (CIALIS) 20 mg, Oral, Daily PRN  ? zolpidem (AMBIEN) 10 MG tablet TAKE 1/2-1 TABLET BY MOUTH AT BEDTIME AS NEEDED FOR SLEEP.  ? ? ?   ?Objective:  ? Physical Exam ?Temp 98 ?F (36.7 ?C) (Oral)   Resp 18   Ht 6\' 2"  (1.88 m)   Wt 256 lb 9.6 oz (116.4 kg)   SpO2 95%   BMI 32.95 kg/m?  ?General:   ?Well developed, NAD, BMI noted. ?HEENT:  ?Normocephalic . Face symmetric, atraumatic ?Lungs:  ?CTA B ?Normal respiratory effort, no intercostal retractions, no accessory muscle use. ?Heart: RRR,  no murmur.  ?Lower extremities: no pretibial edema bilaterally  ?Skin: Not pale. Not jaundice ?Neurologic:  ?alert & oriented X3.  ?Speech normal, gait appropriate for age and unassisted ?Psych--  ?Cognition  and judgment appear intact.  ?Cooperative with normal attention span and concentration.  ?Behavior appropriate. ?No anxious or depressed appearing.  ? ?   ?Assessment   ? ? Assessment (new patient 08/2020, referred by Dr. Johnsie Cancel) ?DM: A1c 6.5 (June 2022) ?HTN ?High cholesterol ?Insomnia  ?CV: ?--AVR, bioprosthetic, 2018 in [place]. ?--Postop A. Fib >>> on  A. Fib again ~ 05-2020  ?--No CAD ?--Ablation 01-2021 ?Gout  ?OSA : better after uvulectomy  (~ 2000s).  sleep study 05/2021, was Rx a CPAP ?Dry skin: rarely use topical steroids  ? ?PLAN ?HTN: Much improved in the last few weeks, see HPI, in addition to losing weight due to eating healthier, his new job is physically demanding.  No chest pain, no difficulty breathing, BP sometimes 80/60 with some dizziness. ?Orthostatic vital signs today: Negative. ?Plan:  ?Stop olmesartan-amlodipine-HCTZ (40 - 5 - 25) ?Start olmesartan HCTZ: 40-12 0.5. ?Continue amlodipine 5 mg, metoprolol XL 100 mg. ?Monitor BPs, consider further adjustment (up or down) of BP meds. ? ? ?This visit occurred during the SARS-CoV-2 public health emergency.  Safety protocols were in place, including screening questions prior to the visit, additional usage of staff PPE, and extensive cleaning of exam room while observing appropriate contact time as indicated for disinfecting solutions.  ? ?

## 2021-09-21 NOTE — Assessment & Plan Note (Signed)
HTN: Much improved in the last few weeks, see HPI, in addition to losing weight due to eating healthier, his new job is physically demanding.  No chest pain, no difficulty breathing, BP sometimes 80/60 with some dizziness. ?Orthostatic vital signs today: Negative. ?Plan:  ?Stop olmesartan-amlodipine-HCTZ (40 - 5 - 25) ?Start olmesartan HCTZ: 40-12 0.5. ?Continue amlodipine 5 mg, metoprolol XL 100 mg. ?Monitor BPs, consider further adjustment (up or down) of BP meds. ? ?

## 2021-09-21 NOTE — Patient Instructions (Signed)
Check the  blood pressure regularly ?BP GOAL is between 110/65 and  135/85. ?If it is consistently higher or lower, let me know ? ?  ?

## 2021-10-02 ENCOUNTER — Encounter: Payer: Self-pay | Admitting: Internal Medicine

## 2021-10-16 ENCOUNTER — Other Ambulatory Visit: Payer: Self-pay | Admitting: Internal Medicine

## 2021-10-16 MED ORDER — HYDROCHLOROTHIAZIDE 12.5 MG PO TABS: 12.5000 mg | ORAL_TABLET | Freq: Every day | ORAL | 0 refills | Status: DC

## 2021-10-16 MED ORDER — OLMESARTAN MEDOXOMIL 40 MG PO TABS: 40.0000 mg | ORAL_TABLET | Freq: Every day | ORAL | 0 refills | Status: DC

## 2021-10-16 NOTE — Telephone Encounter (Signed)
Separate rx's sent for olmesartan 40mg  and hctz 12.5mg  ?

## 2021-10-16 NOTE — Telephone Encounter (Signed)
Script Clarification:INSURANCE CLASSIFIES COMBO DRUG AS HIGHER TIER, PLEASE SEND SEPARATE RXS FOR OLMESARTAN AND HCTZ TO SAVE PT MONEY. ?

## 2021-10-17 ENCOUNTER — Encounter: Payer: Self-pay | Admitting: Internal Medicine

## 2021-11-12 ENCOUNTER — Encounter: Payer: Self-pay | Admitting: Internal Medicine

## 2021-11-21 ENCOUNTER — Other Ambulatory Visit: Payer: Self-pay | Admitting: Internal Medicine

## 2021-11-25 ENCOUNTER — Other Ambulatory Visit: Payer: Self-pay | Admitting: Internal Medicine

## 2022-01-03 ENCOUNTER — Ambulatory Visit (INDEPENDENT_AMBULATORY_CARE_PROVIDER_SITE_OTHER): Payer: Medicare Other | Admitting: Orthopaedic Surgery

## 2022-01-03 ENCOUNTER — Encounter: Payer: Self-pay | Admitting: Orthopaedic Surgery

## 2022-01-03 DIAGNOSIS — M791 Myalgia, unspecified site: Secondary | ICD-10-CM | POA: Diagnosis not present

## 2022-01-03 MED ORDER — LIDOCAINE HCL 1 % IJ SOLN
1.0000 mL | INTRAMUSCULAR | Status: AC | PRN
  Administered 2022-01-03: 1 mL

## 2022-01-03 MED ORDER — METHYLPREDNISOLONE ACETATE 40 MG/ML IJ SUSP
40.0000 mg | INTRAMUSCULAR | Status: AC | PRN
  Administered 2022-01-03: 40 mg via INTRAMUSCULAR

## 2022-01-03 NOTE — Progress Notes (Signed)
Office Visit Note   Patient: Cameron Carey           Date of Birth: [birth year]           MRN: [id number] Visit Date: 01/03/2022              Requested by: Wanda Plump, MD [address] [place],  [place] [address] PCP: Wanda Plump, MD   Assessment & Plan: Visit Diagnoses:  1. Trigger point of right side of body     Plan: Based on my clinical exam, it feels like this is more of a trigger point area of pain.  I recommend a steroid injection and he agreed to this and tolerated it well.  This was mixed with lidocaine.  I then after a few minutes had him lay down flat and his pain was gone.  He is appropriate candidate for outpatient physical therapy could also help decrease the pain in the trigger point area.  We can always repeat an injection in 8 to 12 weeks if needed.  All question concerns were answered addressed.  Follow-up is as needed.  Follow-Up Instructions: Return if symptoms worsen or fail to improve.   Orders:  Orders Placed This Encounter  Procedures   Trigger Point Inj   No orders of the defined types were placed in this encounter.     Procedures: Trigger Point Inj  Date/Time: 01/03/2022 1:17 PM  Performed by: Kathryne Hitch, MD Authorized by: Kathryne Hitch, MD   Indications:  Pain Total # of Trigger Points:  1 Location: back   Medications #1:  1 mL lidocaine 1 %; 40 mg methylPREDNISolone acetate 40 MG/ML    Clinical Data: No additional findings.   Subjective: Chief Complaint  Patient presents with   Lower Back - Pain  Cameron Carey is a very pleasant and active 66 year old gentleman who comes in with right-sided back pain but he points to more of the right side of the parascapular area around the lower thoracic and upper lumbar area as a source of his pain but not really in the back itself.  There is no radicular component of his pain.  It hurts more when he is laying flat whether he is exercising performing bench pressing or  sleeping at night.  This is been going on for about a year and a half.  He does have x-rays that the chiropractor took of his back who is a friend of his.  HPI  Review of Systems There is currently listed no headache, chest pain, shortness of breath, fever, chills, nausea, vomiting  Objective: Vital Signs: There were no vitals taken for this visit.  Physical Exam He is alert and orient x3 and in no acute distress Ortho Exam On his lower back to the right side between the lower thoracic and upper lumbar spine there is a trigger point that is painful to deep palpation and is just below the scapular area as well.  There is no muscle atrophy in this area no redness.  There is no evidence of rash.  Remainder of exam is normal.  When I laterally rotate and laterally bend him there is reproducible pain. Specialty Comments:  No specialty comments available.  Imaging: No results found. The x-rays that accompany him show degenerative changes at the multiple levels of the lumbar spine.  There is no acute findings.  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   Hx of combined right and left heart catheterization 08/10/2020   History of heart valve replacement 08/03/2020   Localized, primary osteoarthritis 12/18/2016   Hammer toe 08/31/2013   Transient ischemic attack 01/14/2013   Past Medical History:  Diagnosis Date   Abnormal stress echo    Allergy    Anxiety    Aortic valve stenosis    Arthropathy    Atrial fibrillation (HCC)    Brachial radiculitis     Edema    Enthesopathy of ankle    Esophageal dysmotility    Gout    Hammertoe of second toe of right foot    corrected   History of heart valve replacement    HTN (hypertension)    Hx of transient ischemic attack (TIA) 12/2012   Hypercholesterolemia    Insomnia    Left ventricular hypertrophy    Obesity (BMI 30-39.9)    Orthostatic hypotension    Syncope and collapse     Family History  Problem Relation Age of Onset   Heart murmur Father    Colon cancer Neg Hx    Prostate cancer Neg Hx    Diabetes Neg Hx     Past Surgical History:  Procedure Laterality Date   AORTIC VALVE REPLACEMENT  2016   ATRIAL FIBRILLATION ABLATION N/A 02/02/2021   Procedure: ATRIAL FIBRILLATION ABLATION;  Surgeon: Lanier Prude, MD;  Location: [facility];  Service: Cardiovascular;  Laterality: N/A;   CARDIOVERSION  06/20/2015   CARDIOVERSION N/A 10/03/2020   Procedure: CARDIOVERSION;  Surgeon: Wendall Stade, MD;  Location: [facility];  Service: Cardiovascular;  Laterality: N/A;   HAMMER TOE SURGERY Right 2015   MEDIAL PARTIAL KNEE REPLACEMENT Bilateral 2020   2 procedures done 1 month apart    METATARSAL OSTEOTOMY Right 2015   R 2nd metatarsal   NASAL SEPTUM SURGERY  1996   RIGHT AND LEFT HEART CATH  02/13/2015   TEE WITHOUT CARDIOVERSION N/A 02/01/2021   Procedure: TRANSESOPHAGEAL ECHOCARDIOGRAM (TEE);  Surgeon: Wendall Stade, MD;  Location[facility];  Service: Cardiovascular;  Laterality: N/A;   TONSILLECTOMY     UVULECTOMY     Social History   Occupational History   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

## 2022-01-08 ENCOUNTER — Other Ambulatory Visit: Payer: Self-pay | Admitting: Internal Medicine

## 2022-01-19 ENCOUNTER — Telehealth: Payer: Self-pay | Admitting: Internal Medicine

## 2022-01-21 NOTE — Telephone Encounter (Signed)
Requesting: Ambien 10mg   Contract: 11/27/20 UDS: None Last Visit: 09/21/21 Next Visit:02/28/22 Last Refill: 09/18/21 #30 and 1RF  Please Advise

## 2022-01-21 NOTE — Telephone Encounter (Signed)
PDMP okay, Rx sent 

## 2022-02-07 ENCOUNTER — Encounter: Payer: Self-pay | Admitting: Internal Medicine

## 2022-02-13 ENCOUNTER — Other Ambulatory Visit: Payer: Self-pay | Admitting: Internal Medicine

## 2022-02-26 ENCOUNTER — Ambulatory Visit: Payer: 59 | Admitting: Internal Medicine

## 2022-02-28 ENCOUNTER — Ambulatory Visit: Payer: 59 | Admitting: Internal Medicine

## 2022-03-11 ENCOUNTER — Telehealth: Payer: Self-pay | Admitting: Internal Medicine

## 2022-03-11 DIAGNOSIS — E785 Hyperlipidemia, unspecified: Secondary | ICD-10-CM

## 2022-03-11 DIAGNOSIS — I10 Essential (primary) hypertension: Secondary | ICD-10-CM

## 2022-03-11 DIAGNOSIS — E119 Type 2 diabetes mellitus without complications: Secondary | ICD-10-CM

## 2022-03-11 NOTE — Telephone Encounter (Signed)
Please advise 

## 2022-03-11 NOTE — Telephone Encounter (Signed)
CMP, FLP, CBC, A1c DX: DM, HTN, high cholesterol

## 2022-03-11 NOTE — Telephone Encounter (Signed)
Patient called to ask if he was going to need labs done during his visit. If so, he would like to get them done before his appointment. Please call to advise.

## 2022-03-11 NOTE — Telephone Encounter (Signed)
Please schedule lab appt at his convenience. Thank you.

## 2022-03-12 ENCOUNTER — Other Ambulatory Visit (INDEPENDENT_AMBULATORY_CARE_PROVIDER_SITE_OTHER): Payer: Medicare Other

## 2022-03-12 DIAGNOSIS — I10 Essential (primary) hypertension: Secondary | ICD-10-CM | POA: Diagnosis not present

## 2022-03-12 DIAGNOSIS — E785 Hyperlipidemia, unspecified: Secondary | ICD-10-CM

## 2022-03-12 DIAGNOSIS — E119 Type 2 diabetes mellitus without complications: Secondary | ICD-10-CM

## 2022-03-12 LAB — CBC WITH DIFFERENTIAL/PLATELET
Basophils Absolute: 0 10*3/uL (ref 0.0–0.1)
Basophils Relative: 0.8 % (ref 0.0–3.0)
Eosinophils Absolute: 0.2 10*3/uL (ref 0.0–0.7)
Eosinophils Relative: 4.4 % (ref 0.0–5.0)
HCT: 42.2 % (ref 39.0–52.0)
Hemoglobin: 13.9 g/dL (ref 13.0–17.0)
Lymphocytes Relative: 33.4 % (ref 12.0–46.0)
Lymphs Abs: 1.9 10*3/uL (ref 0.7–4.0)
MCHC: 32.9 g/dL (ref 30.0–36.0)
MCV: 96.4 fl (ref 78.0–100.0)
Monocytes Absolute: 0.5 10*3/uL (ref 0.1–1.0)
Monocytes Relative: 8.6 % (ref 3.0–12.0)
Neutro Abs: 3 10*3/uL (ref 1.4–7.7)
Neutrophils Relative %: 52.8 % (ref 43.0–77.0)
Platelets: 160 10*3/uL (ref 150.0–400.0)
RBC: 4.38 Mil/uL (ref 4.22–5.81)
RDW: 14.2 % (ref 11.5–15.5)
WBC: 5.7 10*3/uL (ref 4.0–10.5)

## 2022-03-12 LAB — COMPREHENSIVE METABOLIC PANEL
ALT: 18 U/L (ref 0–53)
AST: 20 U/L (ref 0–37)
Albumin: 4.3 g/dL (ref 3.5–5.2)
Alkaline Phosphatase: 94 U/L (ref 39–117)
BUN: 26 mg/dL — ABNORMAL HIGH (ref 6–23)
CO2: 30 mEq/L (ref 19–32)
Calcium: 9.8 mg/dL (ref 8.4–10.5)
Chloride: 101 mEq/L (ref 96–112)
Creatinine, Ser: 1.02 mg/dL (ref 0.40–1.50)
GFR: 76.83 mL/min (ref >60.00)
Glucose, Bld: 104 mg/dL — ABNORMAL HIGH (ref 70–99)
Potassium: 4.1 mEq/L (ref 3.5–5.1)
Sodium: 141 mEq/L (ref 135–145)
Total Bilirubin: 1.1 mg/dL (ref 0.2–1.2)
Total Protein: 7.2 g/dL (ref 6.0–8.3)

## 2022-03-12 LAB — LIPID PANEL
Cholesterol: 180 mg/dL (ref 0–200)
HDL: 53.4 mg/dL (ref >39.00)
LDL Cholesterol: 101 mg/dL — ABNORMAL HIGH (ref 0–99)
NonHDL: 127.01
Total CHOL/HDL Ratio: 3
Triglycerides: 128 mg/dL (ref 0.0–149.0)
VLDL: 25.6 mg/dL (ref 0.0–40.0)

## 2022-03-12 LAB — HEMOGLOBIN A1C: Hgb A1c MFr Bld: 5.8 % (ref 4.6–6.5)

## 2022-03-14 ENCOUNTER — Encounter: Payer: Self-pay | Admitting: Internal Medicine

## 2022-03-14 ENCOUNTER — Ambulatory Visit (INDEPENDENT_AMBULATORY_CARE_PROVIDER_SITE_OTHER): Payer: Medicare Other | Admitting: Internal Medicine

## 2022-03-14 VITALS — BP 120/72 | HR 55 | Temp 97.9°F | Resp 18 | Ht 74.0 in | Wt 222.5 lb

## 2022-03-14 DIAGNOSIS — Z23 Encounter for immunization: Secondary | ICD-10-CM | POA: Diagnosis not present

## 2022-03-14 DIAGNOSIS — I10 Essential (primary) hypertension: Secondary | ICD-10-CM

## 2022-03-14 DIAGNOSIS — E785 Hyperlipidemia, unspecified: Secondary | ICD-10-CM

## 2022-03-14 MED ORDER — TADALAFIL 20 MG PO TABS: 20.0000 mg | ORAL_TABLET | Freq: Every day | ORAL | 5 refills | Status: DC | PRN

## 2022-03-14 NOTE — Patient Instructions (Addendum)
HOLD hydrochlorothiazide 12.5 mg  Continue all other  BP medications  As long as your blood pressure is normal you do not need to take hydrochlorothiazide.  BP GOAL is between 110/65 and  135/85.    GO TO THE FRONT DESK, PLEASE SCHEDULE YOUR APPOINTMENTS Come back for   a physical exam by 07-2022   Vaccines I recommend:  Covid booster RSV vaccine   Per our records you are due for your diabetic eye exam. Please contact your eye doctor to schedule an appointment. Please have them send copies of your office visit notes to [place]. Our fax number is [phone]. If you need a referral to an eye doctor please let us know.

## 2022-03-14 NOTE — Progress Notes (Signed)
Subjective:    Patient ID: Cameron Carey, male    DOB: 1955-09-30, 66 y.o.   MRN: [id number]  DOS:  03/14/2022 Type of visit - description: f/u  Here for follow-up Doing great Continue losing weight due to being more active and watching his diet closely. Denies chest pain, difficulty breathing or lower extremity edema Continue with thoracic spine pain, no radiation, no paresthesias anywhere  Wt Readings from Last 3 Encounters:  03/14/22 222 lb 8 oz (100.9 kg)  09/21/21 256 lb 9.6 oz (116.4 kg)  08/20/21 265 lb 8 oz (120.4 kg)   Review of Systems See above   Past Medical History:  Diagnosis Date   Abnormal stress echo    Allergy    Anxiety    Aortic valve stenosis    Arthropathy    Atrial fibrillation (HCC)    Brachial radiculitis    Edema    Enthesopathy of ankle    Esophageal dysmotility    Gout    Hammertoe of second toe of right foot    corrected   History of heart valve replacement    HTN (hypertension)    Hx of transient ischemic attack (TIA) 12/2012   Hypercholesterolemia    Insomnia    Left ventricular hypertrophy    Obesity (BMI 30-39.9)    Orthostatic hypotension    Syncope and collapse     Past Surgical History:  Procedure Laterality Date   AORTIC VALVE REPLACEMENT  2016   ATRIAL FIBRILLATION ABLATION N/A 02/02/2021   Procedure: ATRIAL FIBRILLATION ABLATION;  Surgeon: Lanier Prude, MD;  Location: [facility];  Service: Cardiovascular;  Laterality: N/A;   CARDIOVERSION  06/20/2015   CARDIOVERSION N/A 10/03/2020   Procedure: CARDIOVERSION;  Surgeon: Wendall Stade, MD;  Location: [facility]OS[facility];  Service: Cardiovascular;  Laterality: N/A;   HAMMER TOE SURGERY Right 2015   MEDIAL PARTIAL KNEE REPLACEMENT Bilateral 2020   2 procedures done 1 month apart    METATARSAL OSTEOTOMY Right 2015   R 2nd metatarsal   NASAL SEPTUM SURGERY  1996   RIGHT AND LEFT HEART CATH  02/13/2015   TEE WITHOUT CARDIOVERSION N/A 02/01/2021   Procedure:  TRANSESOPHAGEAL ECHOCARDIOGRAM (TEE);  Surgeon: Wendall Stade, MD;  Location: [facility] ENDOSCOPY;  Service: Cardiovascular;  Laterality: N/A;   TONSILLECTOMY     UVULECTOMY      Current Outpatient Medications  Medication Instructions   allopurinol (ZYLOPRIM) 100 MG tablet TAKE 2 TABLETS BY MOUTH EVERY DAY   amLODipine (NORVASC) 5 mg, Oral, Daily   atorvastatin (LIPITOR) 40 mg, Oral, Daily at bedtime   fluocinolone (VANOS) 0.01 % cream 1 application , Topical, Daily PRN   hydrochlorothiazide (HYDRODIURIL) 12.5 mg, Oral, Daily   hydrOXYzine (ATARAX) 25 mg, Oral, At bedtime PRN   metoprolol succinate (TOPROL-XL) 100 MG 24 hr tablet TAKE 1 AND 1/2 TABLETS (150 MG TOTAL) BY MOUTH DAILY. TAKE WITH OR IMMEDIATELY FOLLOWING A MEAL   Multiple Vitamin (MULTIVITAMIN WITH MINERALS) TABS tablet 1 tablet, Oral, Daily   olmesartan (BENICAR) 40 MG tablet TAKE 1 TABLET BY MOUTH EVERY DAY   rivaroxaban (XARELTO) 20 mg, Oral, Daily with supper   tadalafil (CIALIS) 20 mg, Oral, Daily PRN   zolpidem (AMBIEN) 10 MG tablet TAKE 1/2 TO 1 TABLET BY MOUTH AT BEDTIME AS NEEDED FOR SLEEP       Objective:   Physical Exam BP 120/72   Pulse (!) 55   Temp 97.9 F (36.6 C) (Oral)   Resp  18   Ht 6\' 2"  (1.88 m)   Wt 222 lb 8 oz (100.9 kg)   SpO2 95%   BMI 28.57 kg/m  General:   Well developed, NAD, BMI noted. HEENT:  Normocephalic . Face symmetric, atraumatic Lungs:  CTA B Normal respiratory effort, no intercostal retractions, no accessory muscle use. Heart: RRR,  no murmur.  Lower extremities: no pretibial edema bilaterally  Skin: Not pale. Not jaundice Neurologic:  alert & oriented X3.  Speech normal, gait appropriate for age and unassisted Psych--  Cognition and judgment appear intact.  Cooperative with normal attention span and concentration.  Behavior appropriate. No anxious or depressed appearing.      Assessment     Assessment (new patient 08/2020, referred by Dr. Johnsie Cancel) DM: A1c 6.5 (June  2022) HTN High cholesterol Insomnia  CV: --AVR, bioprosthetic, 2018 in [place]. --Postop A. Fib >>> on  A. Fib again ~ 05-2020  --No CAD --Ablation 01-2021 Gout  OSA : better after uvulectomy  (~ 2000s).  sleep study 05/2021, was Rx a CPAP Dry skin: rarely use topical steroids   PLAN DM: Diet controlled, Last A1c was 5.8. praised HTN: On amlodipine, metoprolol, Benicar, HCTZ.  At some point he was dizzy and BP was low, he decided to take all of his medications at night (denies any urinary frequency or nocturia) and BPs now are typically okay. We agreed to  hold HCTZ, he may not need as his life is better, monitor BPs.  See AVS. High cholesterol: Last LDL 101, on high intensity statins, no known CAD.  Recommend no change. A-fib, history of colon on Xarelto.  Samples provided OSA: Was Rx CPAP 05/2021, he never got it, since then has lost a significant amount of weight and he reports no fatigue, no snoring.  Reassess the need for CPAP periodically, does not seem to needed at this point. Gout: Wonders if he needs to continue allopurinol, since we agreed to hold HCTZ, will consider decrease allopurinol and even stop it if possible in the next few months Thoracic pain: Ongoing, recommend to discuss again with Dr. Ninfa Linden, Ortho.  Weight loss: praised, doing great ED : Refill to [facility] Vaccine advice provided RTC 5 months CPX

## 2022-03-15 NOTE — Assessment & Plan Note (Signed)
DM: Diet controlled, Last A1c was 5.8. praised HTN: On amlodipine, metoprolol, Benicar, HCTZ.  At some point he was dizzy and BP was low, he decided to take all of his medications at night (denies any urinary frequency or nocturia) and BPs now are typically okay. We agreed to  hold HCTZ, he may not need as his life is better, monitor BPs.  See AVS. High cholesterol: Last LDL 101, on high intensity statins, no known CAD.  Recommend no change. A-fib, history of colon on Xarelto.  Samples provided OSA: Was Rx CPAP 05/2021, he never got it, since then has lost a significant amount of weight and he reports no fatigue, no snoring.  Reassess the need for CPAP periodically, does not seem to needed at this point. Gout: Wonders if he needs to continue allopurinol, since we agreed to hold HCTZ, will consider decrease allopurinol and even stop it if possible in the next few months Thoracic pain: Ongoing, recommend to discuss again with Dr. Ninfa Linden, Ortho.  Weight loss: praised, doing great ED : Refill to [facility] Vaccine advice provided RTC 5 months CPX

## 2022-04-18 ENCOUNTER — Other Ambulatory Visit: Payer: Self-pay | Admitting: Internal Medicine

## 2022-05-27 ENCOUNTER — Other Ambulatory Visit: Payer: Self-pay | Admitting: Internal Medicine

## 2022-06-13 ENCOUNTER — Ambulatory Visit (INDEPENDENT_AMBULATORY_CARE_PROVIDER_SITE_OTHER): Payer: Medicare Other | Admitting: Orthopedic Surgery

## 2022-06-13 ENCOUNTER — Ambulatory Visit (INDEPENDENT_AMBULATORY_CARE_PROVIDER_SITE_OTHER): Payer: Medicare Other

## 2022-06-13 ENCOUNTER — Encounter: Payer: Self-pay | Admitting: Orthopedic Surgery

## 2022-06-13 VITALS — BP 175/88 | HR 55 | Ht 74.0 in | Wt 223.0 lb

## 2022-06-13 DIAGNOSIS — M545 Low back pain, unspecified: Secondary | ICD-10-CM | POA: Diagnosis not present

## 2022-06-13 NOTE — Progress Notes (Signed)
Orthopedic Spine Surgery Office Note  Assessment: Patient is a 66 y.o. male with intermittent low back pain. It is worse with extension. Not present at the moment.    Plan: -Explained that initially conservative treatment is tried as a significant number of patients may experience relief with these treatment modalities. Discussed that the conservative treatments include:  -activity modification  -core strengthening  -physical therapy  -over the counter pain medications  -medrol dosepak  -lumbar steroid injections -Since he is not having any pain at the moment, recommended core strengthening to help with any future flares and hopefully decrease their severity should they recur. Provided him with a hand out of core strengthening exercises. Instructed him to do these exercises 3-4 times per week -Can use tylenol and/or NSAIDs during flares  -Patient should return to office on an as needed basis    Patient expressed understanding of the plan and all questions were answered to the patient's satisfaction.   ___________________________________________________________________________   History:  Patient is a 66 y.o. male who presents today for lumbar spine. Patient has had a little over a year of intermittent back pain that he feels in his lower back. There was no trauma or injury that brought on the pain. He cannot think of any activities that usually precede onset of pain. It seems to occur randomly. Pain does not radiate into either leg. When it comes on, it can be severe and interferes with his activities. Other times, it is more mild in nature. He had a particularly bad flare in July 2023 and Dr. Magnus Ivan did an injection that seemed to help. Pain is worse with extension. For example, he states he gets it if he lays flat to do a bench press but if he does a press at a 45 degree incline, he does not get the pain. Pain usually lasts a few days and then resolves. Does not have any paresthesias or  numbness.    Weakness: denies Symptoms of imbalance: denies Paresthesias and numbness: denies Bowel or bladder incontinence: denies Saddle anesthesia: denies  Treatments tried: NSAIDs, tylenol, activity modification  Review of systems: Denies fevers and chills, night sweats, unexplained weight loss, history of cancer, pain that wakes them at night  Past medical history: HLD HTN Atrial fibrillation Insomnia Gout TIA  Allergies: bactrim  Past surgical history:  Aortic valve replacement Atrial fibrillation ablation Nasal septum surgery Hammer toe surgery Partial knee replacement Uvulectomy Tonsillectomy  Social history: Denies use of nicotine product (smoking, vaping, patches, smokeless) Alcohol use: yes, 6 drinks per week Denies recreational drug use   Physical Exam:  General: no acute distress, appears stated age Neurologic: alert, answering questions appropriately, following commands Respiratory: unlabored breathing on room air, symmetric chest rise Psychiatric: appropriate affect, normal cadence to speech   MSK (spine):  -Strength exam      Left  Right EHL    5/5  5/5 TA    5/5  5/5 GSC    5/5  5/5 Knee extension  5/5  5/5 Hip flexion   5/5  5/5  -Sensory exam    Sensation intact to light touch in L3-S1 nerve distributions of bilateral lower extremities  -Achilles DTR: 2/4 on the left, 2/4 on the right -Patellar tendon DTR: 2/4 on the left, 2/4 on the right  -Straight leg raise: negative  -Contralateral straight leg raise: negative -Clonus: no beats bilaterally  -Left hip exam: no pain through range of motion, negative stinchfield  -Right hip exam: no pain through range  of motion, negative stinchfield   Imaging: XR of the lumbar spine from 06/13/2022 was independently reviewed and interpreted, showing disc height loss at L5/S1. Anterior osteophyte formation at multiple levels. No fracture or dislocation. No evidence of instability on  flexion/extension views.    Patient name: Cameron Carey Patient MRN: [id number] Date of visit: 06/13/22

## 2022-06-27 ENCOUNTER — Ambulatory Visit: Payer: Medicare Other | Attending: Physician Assistant | Admitting: Cardiology

## 2022-06-27 ENCOUNTER — Encounter: Payer: Self-pay | Admitting: Cardiology

## 2022-06-27 VITALS — BP 134/92 | HR 49 | Ht 74.0 in | Wt 224.0 lb

## 2022-06-27 DIAGNOSIS — Z952 Presence of prosthetic heart valve: Secondary | ICD-10-CM | POA: Diagnosis not present

## 2022-06-27 DIAGNOSIS — I480 Paroxysmal atrial fibrillation: Secondary | ICD-10-CM

## 2022-06-27 DIAGNOSIS — D6869 Other thrombophilia: Secondary | ICD-10-CM | POA: Insufficient documentation

## 2022-06-27 DIAGNOSIS — I10 Essential (primary) hypertension: Secondary | ICD-10-CM

## 2022-06-27 NOTE — Patient Instructions (Signed)
Medication Instructions:   Your physician recommends that you continue on your current medications as directed. Please refer to the Current Medication list given to you today.  *If you need a refill on your cardiac medications before your next appointment, please call your pharmacy*   Lab Work: NONE ORDERED  TODAY   If you have labs (blood work) drawn today and your tests are completely normal, you will receive your results only by: MyChart Message (if you have MyChart) OR A paper copy in the mail If you have any lab test that is abnormal or we need to change your treatment, we will call you to review the results.   Testing/Procedures: NONE ORDERED  TODAY    Follow-Up: At [facility], you and your health needs are our priority.  As part of our continuing mission to provide you with exceptional heart care, we have created designated Provider Care Teams.  These Care Teams include your primary Cardiologist (physician) and Advanced Practice Providers (APPs -  Physician Assistants and Nurse Practitioners) who all work together to provide you with the care you need, when you need it.  We recommend signing up for the patient portal called "MyChart".  Sign up information is provided on this After Visit Summary.  MyChart is used to connect with patients for Virtual Visits (Telemedicine).  Patients are able to view lab/test results, encounter notes, upcoming appointments, etc.  Non-urgent messages can be sent to your provider as well.   To learn more about what you can do with MyChart, go to [email or web address].    Your next appointment:   6 month(s)  The format for your next appointment:   In Person  Provider:   You may see CAMERON T LAMBERT, MD or one of the following Advanced Practice Providers on your designated Care Team:   Renee Ursuy, PA-C Michael "Andy" Tillery, PA-C  Other Instructions   Important Information About Sugar       

## 2022-06-27 NOTE — Progress Notes (Signed)
Cardiology Office Note Date:  06/27/2022  Patient ID:  Cameron Carey, Cameron Carey Jan 07, 1956, MRN [id number] PCP:  Colon Branch, MD  Cardiologist:  Jenkins Rouge, MD Electrophysiologist: Vickie Epley, MD  Chief Complaint: 1year follow-up  History of Present Illness: Cameron Carey is a 67 y.o. male with PMH notable for AVR w post-op Afib, Afib s/p ablation 01/2021, HTN; seen today for Vickie Epley, MD for routine electrophysiology followup.  Last saw Dr. Quentin Ore 05/2021 for post-ablation appointment. Doing well, no AF  Since last being seen in our clinic the patient reports doing very well. He un-retired and started working for [employer]. Because of the increased activity w work and outside of work, and improved diet, has purposefully lost 30+lbs. Lately has gained some of the weight back with cooler weather.   He does not think he has had any AF episodes since ablation. His AF made him feel very fatigued.  He questions when/if he can stop xarelto. Does not have ILR, apple watch, [employer].  .  he denies chest pain, palpitations, dyspnea, PND, orthopnea, nausea, vomiting, dizziness, syncope, edema, weight gain, or early satiety.     AFib History: Afib after AVR 2016, DCCV x 3 Routine card appt 05/2020, found to be in AF but moving to [facility] 07/2020 - est care in [place] w Dr. Johnsie Cancel, AF >> DCCV w ERAF 01/2021 - PVI, posterior wall ablation  Past Medical History:  Diagnosis Date   Abnormal stress echo    Allergy    Anxiety    Aortic valve stenosis    Arthropathy    Atrial fibrillation ([place])    Brachial radiculitis    Edema    Enthesopathy of ankle    Esophageal dysmotility    Gout    Hammertoe of second toe of right foot    corrected   History of heart valve replacement    HTN (hypertension)    Hx of transient ischemic attack (TIA) 12/2012   Hypercholesterolemia    Insomnia    Left ventricular hypertrophy    Obesity (BMI 30-39.9)    Orthostatic hypotension     Syncope and collapse     Past Surgical History:  Procedure Laterality Date   AORTIC VALVE REPLACEMENT  2016   ATRIAL FIBRILLATION ABLATION N/A 02/02/2021   Procedure: ATRIAL FIBRILLATION ABLATION;  Surgeon: Vickie Epley, MD;  Location: [place] CV [facility];  Service: Cardiovascular;  Laterality: N/A;   CARDIOVERSION  06/20/2015   CARDIOVERSION N/A 10/03/2020   Procedure: CARDIOVERSION;  Surgeon: Josue Hector, MD;  Location: [place];  Service: Cardiovascular;  Laterality: N/A;   HAMMER TOE SURGERY Right 2015   MEDIAL PARTIAL KNEE REPLACEMENT Bilateral 2020   2 procedures done 1 month apart    METATARSAL OSTEOTOMY Right 2015   R 2nd metatarsal   NASAL SEPTUM SURGERY  1996   RIGHT AND LEFT HEART CATH  02/13/2015   TEE WITHOUT CARDIOVERSION N/A 02/01/2021   Procedure: TRANSESOPHAGEAL ECHOCARDIOGRAM (TEE);  Surgeon: Josue Hector, MD;  Location: [facility] ENDOSCOPY;  Service: Cardiovascular;  Laterality: N/A;   TONSILLECTOMY     UVULECTOMY      Current Outpatient Medications  Medication Sig Dispense Refill   allopurinol (ZYLOPRIM) 100 MG tablet Take 2 tablets (200 mg total) by mouth daily. 180 tablet 1   amLODipine (NORVASC) 5 MG tablet Take 1 tablet (5 mg total) by mouth daily. 90 tablet 1   atorvastatin (LIPITOR) 40 MG tablet Take 1 tablet (  40 mg total) by mouth at bedtime. 90 tablet 1   fluocinolone (VANOS) 0.01 % cream Apply 1 application topically daily as needed (dry skin). 60 g 3   hydrochlorothiazide (HYDRODIURIL) 12.5 MG tablet Take 1 tablet (12.5 mg total) by mouth daily. 90 tablet 1   hydrOXYzine (ATARAX/VISTARIL) 25 MG tablet Take 25 mg by mouth at bedtime as needed for itching.     metoprolol succinate (TOPROL-XL) 100 MG 24 hr tablet Take 1.5 tablets (150 mg total) by mouth daily. Take with or immediately following a meal 135 tablet 1   Multiple Vitamin (MULTIVITAMIN WITH MINERALS) TABS tablet Take 1 tablet by mouth daily.     olmesartan (BENICAR) 40 MG tablet Take 1  tablet (40 mg total) by mouth daily. 90 tablet 1   rivaroxaban (XARELTO) 20 MG TABS tablet Take 1 tablet (20 mg total) by mouth daily with supper. 90 tablet 1   tadalafil (CIALIS) 20 MG tablet Take 1 tablet (20 mg total) by mouth daily as needed for erectile dysfunction. 10 tablet 5   zolpidem (AMBIEN) 10 MG tablet TAKE 1/2 TO 1 TABLET BY MOUTH AT BEDTIME AS NEEDED FOR SLEEP 30 tablet 1   No current facility-administered medications for this visit.    Allergies:   Bactrim [sulfamethoxazole-trimethoprim]   Social History:  The patient  reports that he has never smoked. He has never used smokeless tobacco. He reports current alcohol use of about 2.0 standard drinks of alcohol per week. He reports that he does not use drugs.   Family History:  The patient's family history includes Heart murmur in his father.  ROS:  Please see the history of present illness. All other systems are reviewed and otherwise negative.   PHYSICAL EXAM:  VS:  BP (!) 160/80 (BP Location: Left Arm, Patient Position: Sitting, Cuff Size: Normal)   Pulse (!) 49   Ht 6\' 2"  (1.88 m)   Wt 224 lb (101.6 kg)   BMI 28.76 kg/m  BMI: Body mass index is 28.76 kg/m.  GEN- The patient is well appearing, alert and oriented x 3 today.   HEENT: normocephalic, atraumatic; sclera clear, conjunctiva pink; hearing intact; oropharynx clear; neck supple, no JVP Lungs- Clear to ausculation bilaterally, normal work of breathing.  No wheezes, rales, rhonchi Heart- Regular rate and rhythm, no murmurs, rubs or gallops, PMI not laterally displaced GI- soft, non-tender, non-distended, bowel sounds present, no hepatosplenomegaly Extremities- No peripheral edema. no clubbing or cyanosis; DP/PT/radial pulses 2+ bilaterally MS- no significant deformity or atrophy Skin- warm and dry, no rash or lesion Psych- euthymic mood, full affect Neuro- strength and sensation are intact    EKG is ordered. Personal review of EKG from today shows: SB, rate  49  Recent Labs: 03/12/2022: ALT 18; BUN 26; Creatinine, Ser 1.02; Hemoglobin 13.9; Platelets 160.0; Potassium 4.1; Sodium 141  08/20/2021: Direct LDL 118.0 03/12/2022: Cholesterol 180; HDL 53.40; LDL Cholesterol 101; Total CHOL/HDL Ratio 3; Triglycerides 128.0; VLDL 25.6   CrCl cannot be calculated (Patient's most recent lab result is older than the maximum 21 days allowed.).   Wt Readings from Last 3 Encounters:  06/27/22 224 lb (101.6 kg)  06/13/22 223 lb (101.2 kg)  03/14/22 222 lb 8 oz (100.9 kg)     Additional studies reviewed include: Previous EP notes.   EPS/ Afib ablation (02/02/2021) CONCLUSIONS: 1. Successful PVI 2. Successful ablation/isolation of the posterior wall 3. Intracardiac echo reveals trivial pericardial effusion, normal LV function. 4. No early apparent complications.  TTE 08/17/2020  1. Left ventricular ejection fraction, by estimation, is 55 to 60%. The left ventricle has normal function. The left ventricle has no regional wall motion abnormalities. Left ventricular diastolic parameters are indeterminate.   2. Right ventricular systolic function is mildly reduced. The right ventricular size is moderately enlarged. There is normal pulmonary artery systolic pressure. The estimated right ventricular systolic pressure is 33.0 mmHg. D-shaped interventricular septum suggestive of RV pressure/volume overload.   3. Right atrial size was mildly dilated.   4. The mitral valve is normal in structure. Trivial mitral valve regurgitation. No evidence of mitral stenosis. Moderate mitral annular calcification.   5. There is a bioprosthetic aortic valve. Mean gradient mildly elevated, 19 mmHg. No significant regurgitation.   6. The inferior vena cava is dilated in size with <50% respiratory variability, suggesting right atrial pressure of 15 mmHg.   7. The patient was in atrial fibrillation.    ASSESSMENT AND PLAN:  #) Afib s/p ablation No AF episodes lately   #)  hypercoag d/t AF CHA2DS2-VASc Score = 3 [CHF History: 0, HTN History: 1, Diabetes History: 1, Stroke History: 0, Vascular Disease History: 0, Age Score: 1, Gender Score: 0].  Therefore, the patient's annual risk of stroke is 3.2 %. AC - xarelto 20mg , appropriately based CrCl >100  He questions whether he can stop xarelto since he has been AF free for > 1year.    #) HTN above goal today.  Recommend checking blood pressures 1-2 times per week at home and recording the values.  Recommend bringing these recordings to the primary care physician.   #) AVR Previously followed with Dr. , though has not seen him in awhile.  Euvolemic on exam with good exercise tolerance.    Current medicines are reviewed at length with the patient today.   The patient has concerns regarding his medicines.  The following changes were made today:  none  I will msg Dr. Eden Emms re: stopping xarelto  Labs/ tests ordered today include:  Orders Placed This Encounter  Procedures   EKG 12-Lead     Disposition: Follow up with Dr. Lalla Brothers in 6 months   Signed, Lalla Brothers, NP  06/27/22 11:51 AM   [facility] [address] [place] [phone] (office)  [phone] (fax)

## 2022-07-01 ENCOUNTER — Telehealth: Payer: Self-pay | Admitting: Cardiology

## 2022-07-01 NOTE — Telephone Encounter (Signed)
I called patient to discuss rhythm management strategy to allow him to stop AC. He is interested in pursuing a ILR system with Dr. Quentin Ore.

## 2022-07-19 ENCOUNTER — Ambulatory Visit: Payer: Medicare Other | Attending: Cardiology | Admitting: Cardiology

## 2022-07-19 ENCOUNTER — Encounter: Payer: Self-pay | Admitting: Cardiology

## 2022-07-19 VITALS — BP 132/82 | HR 58 | Ht 74.0 in | Wt 226.0 lb

## 2022-07-19 DIAGNOSIS — I480 Paroxysmal atrial fibrillation: Secondary | ICD-10-CM | POA: Diagnosis present

## 2022-07-19 DIAGNOSIS — I10 Essential (primary) hypertension: Secondary | ICD-10-CM | POA: Diagnosis not present

## 2022-07-19 MED ORDER — ASPIRIN 81 MG PO TBEC: 81.0000 mg | DELAYED_RELEASE_TABLET | Freq: Every day | ORAL | 3 refills | Status: AC

## 2022-07-19 NOTE — Progress Notes (Deleted)
Electrophysiology Office Follow up Visit Note:    Date:  07/19/2022   ID:  DELRON COMER, DOB 1955/12/08, MRN [id number]  PCP:  Colon Branch, MD  [facility] Cardiologist:  Jenkins Rouge, MD  [facility] Electrophysiologist:  Vickie Epley, MD    Interval History:    QUINTRELL BAZE is a 67 y.o. male who presents for a follow up visit. They were last seen in clinic January 4 by Mamie Levers.  He has a history of A-fib and had an ablation in August 2022.  At the appointment earlier this month he reported no recurrent episodes of atrial fibrillation and expressed a strong interest in stopping his Xarelto.  He was interested in pursuing a loop recorder for atrial fibrillation surveillance as a mechanism to avoid long-term exposure to anticoagulation.       Past Medical History:  Diagnosis Date   Abnormal stress echo    Allergy    Anxiety    Aortic valve stenosis    Arthropathy    Atrial fibrillation ([facility])    Brachial radiculitis    Edema    Enthesopathy of ankle    Esophageal dysmotility    Gout    Hammertoe of second toe of right foot    corrected   History of heart valve replacement    HTN (hypertension)    Hx of transient ischemic attack (TIA) 12/2012   Hypercholesterolemia    Insomnia    Left ventricular hypertrophy    Obesity (BMI 30-39.9)    Orthostatic hypotension    Syncope and collapse     Past Surgical History:  Procedure Laterality Date   AORTIC VALVE REPLACEMENT  2016   ATRIAL FIBRILLATION ABLATION N/A 02/02/2021   Procedure: ATRIAL FIBRILLATION ABLATION;  Surgeon: Vickie Epley, MD;  Location: [place] [facility];  Service: Cardiovascular;  Laterality: N/A;   CARDIOVERSION  06/20/2015   CARDIOVERSION N/A 10/03/2020   Procedure: CARDIOVERSION;  Surgeon: Josue Hector, MD;  Location: [place];  Service: Cardiovascular;  Laterality: N/A;   HAMMER TOE SURGERY Right 2015   MEDIAL PARTIAL KNEE REPLACEMENT Bilateral 2020   2 procedures done 1  month apart    METATARSAL OSTEOTOMY Right 2015   R 2nd metatarsal   NASAL SEPTUM SURGERY  1996   RIGHT AND LEFT HEART CATH  02/13/2015   TEE WITHOUT CARDIOVERSION N/A 02/01/2021   Procedure: TRANSESOPHAGEAL ECHOCARDIOGRAM (TEE);  Surgeon: Josue Hector, MD;  Location: [facility] ENDOSCOPY;  Service: Cardiovascular;  Laterality: N/A;   TONSILLECTOMY     UVULECTOMY      Current Medications: No outpatient medications have been marked as taking for the 07/19/22 encounter (Appointment) with Vickie Epley, MD.     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, [employer] director  Tobacco Use   Smoking status: Never   Smokeless tobacco: Never  Vaping Use   Vaping Use: Never used  Substance and Sexual Activity   Alcohol use: Yes    Alcohol/week: 2.0 standard drinks of alcohol    Types: 2 Standard drinks or equivalent per week    Comment: ~ 3 times a week   Drug use: Never   Sexual activity: Not  on file  Other Topics Concern   Not on file  Social History Narrative   Moved to [facility] 06/2020    Social Determinants of Health   Financial Resource Strain: Not on file  Food Insecurity: Not on file  Transportation Needs: Not on file  Physical Activity: Not on file  Stress: Not on file  Social Connections: Not on file     Family History: The patient's family history includes Heart murmur in his father. There is no history of Colon cancer, Prostate cancer, or Diabetes.  ROS:   Please see the history of present illness.    All other systems reviewed and are negative.  EKGs/Labs/Other Studies Reviewed:    The following studies were reviewed today:  Recent Labs: 03/12/2022: ALT 18; BUN 26; Creatinine, Ser 1.02; Hemoglobin 13.9;  Platelets 160.0; Potassium 4.1; Sodium 141  Recent Lipid Panel    Component Value Date/Time   CHOL 180 03/12/2022 0723   TRIG 128.0 03/12/2022 0723   HDL 53.40 03/12/2022 0723   CHOLHDL 3 03/12/2022 0723   VLDL 25.6 03/12/2022 0723   LDLCALC 101 (H) 03/12/2022 0723   LDLDIRECT 118.0 08/20/2021 1533    Physical Exam:    VS:  There were no vitals taken for this visit.    Wt Readings from Last 3 Encounters:  06/27/22 224 lb (101.6 kg)  06/13/22 223 lb (101.2 kg)  03/14/22 222 lb 8 oz (100.9 kg)     GEN: *** Well nourished, well developed in no acute distress CARDIAC: ***RRR, no murmurs, rubs, gallops RESPIRATORY:  Clear to auscultation without rales, wheezing or rhonchi        ASSESSMENT:    No diagnosis found. PLAN:    In order of problems listed above:  #Paroxysmal atrial fibrillation No recurrence after his August 2022 ablation.  He expresses a strong desire to avoid long-term exposure anticoagulation.  We discussed his stroke risk given a CHA2DS2-VASc score of 3.  We discussed ways in which we can use the loop recorder, Apple Watch, Samsung watch, [facility] for atrial fibrillation surveillance.  We discussed how strategies that involves surveillance while off anticoagulation do incur a slightly increased risk of stroke but he thinks the risk/benefit balance is still favorable for his situation.  He would like to proceed with loop recorder implant.  I discussed the loop recorder implant procedure in detail including the risks and he wishes to proceed.  He understands there is a monthly monitoring fee associated with loop recorder monitoring/use.  Will implant loop recorder today he.  He will continue his anticoagulant for 90 days after implant to make sure we do not detect any atrial fibrillation before stopping it.  Once he stops his anticoagulant, he should start aspirin 81 mg by mouth once daily.  Follow-up with an APP in 1 year.  Medication Adjustments/Labs and  Tests Ordered: Current medicines are reviewed at length with the patient today.  Concerns regarding medicines are outlined above.  No orders of the defined types were placed in this encounter.  No orders of the defined types were placed in this encounter.    Signed, Lars Mage, MD, [facility], [facility] 07/19/2022 5:04 AM    Electrophysiology [facility]  -------------------------------------  SURGEON:  Vickie Epley, MD     PREPROCEDURE DIAGNOSIS:  Atrial fibrillation    POSTPROCEDURE DIAGNOSIS: Atrial fibrillation     PROCEDURES:   1. Implantable loop recorder implantation    INTRODUCTION:  SAYID MOLL presents with  a history of atrial fibrillation The costs of loop recorder monitoring have been discussed with the patient.    DESCRIPTION OF PROCEDURE:  Informed written consent was obtained.  The patient required no sedation for the procedure today.  Mapping over the patient's chest was performed to identify the area where electrograms were most prominent for ILR recording.  This area was found to be the left parasternal region over the 4th intercostal space. The patients left chest was therefore prepped and draped in the usual sterile fashion. The skin overlying the left parasternal region was infiltrated with lidocaine for local analgesia.  A 0.5-cm incision was made over the left parasternal region over the 3rd intercostal space.  A subcutaneous ILR pocket was fashioned using a combination of sharp and blunt dissection.  A {LOOPLIST:27736} implantable loop recorder was then placed into the pocket  R waves were very prominent and measured >0.60mV.  Steri- Strips and a sterile dressing were then applied.  There were no early apparent complications.     CONCLUSIONS:   1. Successful implantation of a implantable loop recorder for Atrial fibrillation  2. No early apparent complications.   Sheria Lang T. Lalla Brothers, MD, [facility], [facility] Cardiac Electrophysiology

## 2022-07-19 NOTE — Patient Instructions (Addendum)
Medication Instructions:  Your physician has recommended you make the following change in your medication:  1) STOP taking Xarelto 2) START taking Aspirin 81 mg  Labwork: None ordered.  Testing/Procedures: None ordered.  Follow-Up:  Your physician wants you to follow-up in: one year with Oda Kilts, PA-C or Tommye Standard, PA-C.  You will receive a reminder letter in the mail two months in advance. If you don't receive a letter, please call our office to schedule the follow-up appointment.    Implantable Loop Recorder Placement, Care After This sheet gives you information about how to care for yourself after your procedure. Your health care provider may also give you more specific instructions. If you have problems or questions, contact your health care provider. What can I expect after the procedure? After the procedure, it is common to have: Soreness or discomfort near the incision. Some swelling or bruising near the incision.  Follow these instructions at home: Incision care  Monitor your cardiac device site for redness, swelling, and drainage. Call the device clinic at [phone] if you experience these symptoms or fever/chills.  Keep the large square bandage on your site for 24 hours and then you may remove it yourself. Keep the steri-strips underneath in place.   You may shower after 72 hours / 3 days from your procedure with the steri-strips in place. They will usually fall off on their own, or may be removed after 10 days. Pat dry.   Avoid lotions, ointments, or perfumes over your incision until it is well-healed.  Please do not submerge in water until your site is completely healed.   Your device is MRI compatible.   Remote monitoring is used to monitor your cardiac device from home. This monitoring is scheduled every month by our office. It allows [place] to keep an eye on the function of your device to ensure it is working properly.  If your wound site starts to bleed  apply pressure.    For help with the monitor please call Medtronic Monitor Support Specialist directly at [phone].    If you have any questions/concerns please call the device clinic at [phone].  Activity  Return to your normal activities.  General instructions Follow instructions from your health care provider about how to manage your implantable loop recorder and transmit the information. Learn how to activate a recording if this is necessary for your type of device. You may go through a metal detection gate, and you may let someone hold a metal detector over your chest. Show your ID card if needed. Do not have an MRI unless you check with your health care provider first. Take over-the-counter and prescription medicines only as told by your health care provider. Keep all follow-up visits as told by your health care provider. This is important. Contact a health care provider if: You have redness, swelling, or pain around your incision. You have a fever. You have pain that is not relieved by your pain medicine. You have triggered your device because of fainting (syncope) or because of a heartbeat that feels like it is racing, slow, fluttering, or skipping (palpitations). Get help right away if you have: Chest pain. Difficulty breathing. Summary After the procedure, it is common to have soreness or discomfort near the incision. Change your dressing as told by your health care provider. Follow instructions from your health care provider about how to manage your implantable loop recorder and transmit the information. Keep all follow-up visits as told by your health care provider.  This is important. This information is not intended to replace advice given to you by your health care provider. Make sure you discuss any questions you have with your health care provider. Document Released: 05/22/2015 Document Revised: 07/26/2017 Document Reviewed: 07/26/2017 Elsevier Patient  Education  2020 [employer].

## 2022-07-19 NOTE — Progress Notes (Signed)
Electrophysiology Office Follow up Visit Note:    Date:  07/19/2022   ID:  Cameron Carey, DOB [birth year], MRN [id number]  PCP:  Wanda Plump, MD  [facility] Cardiologist:  Charlton Haws, MD  [facility] Electrophysiologist:  Lanier Prude, MD    Interval History:    Cameron Carey is a 67 y.o. male who presents for a follow up visit. They were last seen in clinic January 4 by Sherie Don.  He has a history of A-fib and had an ablation in August 2022.  At the appointment earlier this month he reported no recurrent episodes of atrial fibrillation and expressed a strong interest in stopping his Xarelto.  He was interested in pursuing a loop recorder for atrial fibrillation surveillance as a mechanism to avoid long-term exposure to anticoagulation.  Today, he is feeling well. Yesterday he took his last dose of Xarelto. He would like to proceed with ILR implant.  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 This has kept him active and busy with frequent walking. 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 This past summer he lost weight from 270 to 215 lbs.  He denies any palpitations, chest pain, shortness of breath, or peripheral edema. No lightheadedness, headaches, syncope, orthopnea, or PND.     Past Medical History:  Diagnosis Date   Abnormal stress echo    Allergy    Anxiety    Aortic valve stenosis    Arthropathy    Atrial fibrillation (HCC)    Brachial radiculitis    Edema    Enthesopathy of ankle    Esophageal dysmotility    Gout    Hammertoe of second toe of right foot    corrected   History of heart valve replacement    HTN (hypertension)    Hx of transient ischemic attack (TIA) 12/2012   Hypercholesterolemia    Insomnia    Left ventricular hypertrophy    Obesity (BMI 30-39.9)    Orthostatic hypotension    Syncope and collapse     Past Surgical History:  Procedure Laterality Date   AORTIC VALVE REPLACEMENT  2016   ATRIAL FIBRILLATION ABLATION N/A  02/02/2021   Procedure: ATRIAL FIBRILLATION ABLATION;  Surgeon: Lanier Prude, MD;  Location: [facility] INVASIVE CV LAB;  Service: Cardiovascular;  Laterality: N/A;   CARDIOVERSION  06/20/2015   CARDIOVERSION N/A 10/03/2020   Procedure: CARDIOVERSION;  Surgeon: Wendall Stade, MD;  Location: [facility] ENDOSCOPY;  Service: Cardiovascular;  Laterality: N/A;   HAMMER TOE SURGERY Right 2015   MEDIAL PARTIAL KNEE REPLACEMENT Bilateral 2020   2 procedures done 1 month apart    METATARSAL OSTEOTOMY Right 2015   R 2nd metatarsal   NASAL SEPTUM SURGERY  1996   RIGHT AND LEFT HEART CATH  02/13/2015   TEE WITHOUT CARDIOVERSION N/A 02/01/2021   Procedure: TRANSESOPHAGEAL ECHOCARDIOGRAM (TEE);  Surgeon: Wendall Stade, MD;  Location: [facility] ENDOSCOPY;  Service: Cardiovascular;  Laterality: N/A;   TONSILLECTOMY     UVULECTOMY      Current Medications: Current Meds  Medication Sig   allopurinol (ZYLOPRIM) 100 MG tablet Take 2 tablets (200 mg total) by mouth daily.   amLODipine (NORVASC) 5 MG tablet Take 1 tablet (5 mg total) by mouth daily.   atorvastatin (LIPITOR) 40 MG tablet Take 1 tablet (40 mg total) by mouth at bedtime.   fluocinolone (VANOS) 0.01 % cream Apply 1 application topically daily as needed (dry skin).   hydrochlorothiazide (HYDRODIURIL) 12.5  MG tablet Take 1 tablet (12.5 mg total) by mouth daily.   hydrOXYzine (ATARAX/VISTARIL) 25 MG tablet Take 25 mg by mouth at bedtime as needed for itching.   metoprolol succinate (TOPROL-XL) 100 MG 24 hr tablet Take 1.5 tablets (150 mg total) by mouth daily. Take with or immediately following a meal   Multiple Vitamin (MULTIVITAMIN WITH MINERALS) TABS tablet Take 1 tablet by mouth daily.   olmesartan (BENICAR) 40 MG tablet Take 1 tablet (40 mg total) by mouth daily.   rivaroxaban (XARELTO) 20 MG TABS tablet Take 1 tablet (20 mg total) by mouth daily with supper.   tadalafil (CIALIS) 20 MG tablet Take 1 tablet (20 mg total) by mouth daily as needed for erectile  dysfunction.   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  Other Topics Concern   Not on file  Social History Narrative   Moved to [facility] 06/2020    Social Determinants of Health   Financial Resource Strain: Not on file  Food Insecurity: Not on file  Transportation Needs: Not on file  Physical Activity: Not on file  Stress: Not on file  Social Connections: Not on file     Family History: The patient's family history includes Heart murmur in his father. There is no history of Colon cancer, Prostate cancer, or Diabetes.  ROS:   Please see the history of present illness.    All other systems reviewed and are negative.  EKGs/Labs/Other Studies Reviewed:    The following studies were reviewed today:  Recent Labs: 03/12/2022: ALT 18; BUN 26; Creatinine, Ser 1.02; Hemoglobin 13.9; Platelets 160.0; Potassium 4.1; Sodium 141   Recent Lipid Panel    Component Value Date/Time   CHOL 180 03/12/2022 0723   TRIG 128.0 03/12/2022 0723   HDL 53.40 03/12/2022 0723   CHOLHDL 3 03/12/2022 0723   VLDL 25.6 03/12/2022 0723   LDLCALC 101 (H) 03/12/2022 0723   LDLDIRECT 118.0 08/20/2021 1533    Physical Exam:    VS:  BP 132/82   Pulse (!) 58   Ht 6\' 2"  (1.88 m)   Wt 226 lb (102.5 kg)    SpO2 97%   BMI 29.02 kg/m     Wt Readings from Last 3 Encounters:  07/19/22 226 lb (102.5 kg)  06/27/22 224 lb (101.6 kg)  06/13/22 223 lb (101.2 kg)     GEN: Well nourished, well developed in no acute distress CARDIAC: RRR, no murmurs, rubs, gallops RESPIRATORY:  Clear to auscultation without rales, wheezing or rhonchi        ASSESSMENT:    1. Paroxysmal atrial fibrillation (HCC)   2. Primary hypertension  PLAN:    In order of problems listed above:  #Paroxysmal atrial fibrillation No recurrence after his August 2022 ablation.  He expresses a strong desire to avoid long-term exposure anticoagulation.  We discussed his stroke risk given a CHA2DS2-VASc score of 3.  We discussed ways in which we can use the loop recorder, Apple Watch, Samsung watch, [facility] for atrial fibrillation surveillance.  We discussed how strategies that involves surveillance while off anticoagulation do incur a slightly increased risk of stroke but he thinks the risk/benefit balance is still favorable for his situation.  He would like to proceed with loop recorder implant.  I discussed the loop recorder implant procedure in detail including the risks and he wishes to proceed.  He understands there is a monthly monitoring fee associated with loop recorder monitoring/use.  Will implant loop recorder today.  He will stop his anticoagulant and take Aspirin 81mg  PO daily.  #Hypertension At goal today.  Recommend checking blood pressures 1-2 times per week at home and recording the values.  Recommend bringing these recordings to the primary care physician.  Follow-up with an APP in 1 year.  Medication Adjustments/Labs and Tests Ordered: Current medicines are reviewed at length with the patient today.  Concerns regarding medicines are outlined above.   No orders of the defined types were placed in this encounter.  No orders of the defined types were placed in this encounter.  I,Mathew  Stumpf,acting as a Education administrator for Vickie Epley, MD.,have documented all relevant documentation on the behalf of Vickie Epley, MD,as directed by  Vickie Epley, MD while in the presence of Vickie Epley, MD.  I, Vickie Epley, MD, have reviewed all documentation for this visit. The documentation on 07/19/22 for the exam, diagnosis, procedures, and orders are all accurate and complete.   Signed, Lars Mage, MD, [facility], [facility] 07/19/2022 11:48 AM    Electrophysiology [facility]  -------------------------------------  SURGEON:  Madelin Rear     PREPROCEDURE DIAGNOSIS:  Atrial fibrillation    POSTPROCEDURE DIAGNOSIS: Atrial fibrillation     PROCEDURES:   1. Implantable loop recorder implantation    INTRODUCTION:  DRAYKE GRABEL presents with a history of atrial fibrillation The costs of loop recorder monitoring have been discussed with the patient.    DESCRIPTION OF PROCEDURE:  Informed written consent was obtained.  The patient required no sedation for the procedure today.  Mapping over the patient's chest was performed to identify the area where electrograms were most prominent for ILR recording.  This area was found to be the left parasternal region over the 4th intercostal space. The patients left chest was therefore prepped and draped in the usual sterile fashion. The skin overlying the left parasternal region was infiltrated with lidocaine for local analgesia.  A 0.5-cm incision was made over the left parasternal region over the 3rd intercostal space.  A subcutaneous ILR pocket was fashioned using a combination of sharp and blunt dissection.  A [employer]tronic Reveal [place] II [phone] [id number]) implantable loop recorder was then placed into the pocket  R waves were very prominent and measured >0.84mV.  Steri- Strips and a sterile dressing were then applied.  There were no early apparent complications.     CONCLUSIONS:   1. Successful implantation of a  implantable loop recorder for Atrial fibrillation  2. No early apparent complications.   Lysbeth Galas T. Quentin Ore, MD, [facility], [facility] Cardiac Electrophysiology

## 2022-07-31 ENCOUNTER — Telehealth: Payer: Self-pay

## 2022-07-31 NOTE — Telephone Encounter (Signed)
LMOVM for pt to call the device clinic. He needs to plug in his monitor for his loop recorder.

## 2022-08-01 ENCOUNTER — Encounter: Payer: Self-pay | Admitting: Internal Medicine

## 2022-08-01 ENCOUNTER — Encounter (HOSPITAL_COMMUNITY): Payer: Self-pay | Admitting: *Deleted

## 2022-08-16 ENCOUNTER — Telehealth: Payer: Self-pay | Admitting: Internal Medicine

## 2022-08-16 NOTE — Telephone Encounter (Signed)
Copied from [place] [id number]. Topic: Medicare AWV >> Aug 16, 2022 11:55 AM Devoria Glassing wrote: Reason for CRM: Called patient to schedule Medicare Annual Wellness Visit (AWV). Left message for patient to call back and schedule Medicare Annual Wellness Visit (AWV).  Last date of AWV: NONE  Please schedule an appointment at any time with Kirke Shaggy, [facility]    If any questions, please contact me.  Thank you ,  Sherol Dade; [place] Direct Dial: [phone]

## 2022-08-16 NOTE — Telephone Encounter (Signed)
Patient called and declined appt

## 2022-08-20 NOTE — Telephone Encounter (Signed)
I spoke with the patient and he agreed to check on his monitor tonight to make sure it is working.

## 2022-08-21 NOTE — Telephone Encounter (Signed)
Pt monitor has updated. Transmission received 08/21/2022

## 2022-08-29 ENCOUNTER — Telehealth: Payer: Self-pay | Admitting: Family

## 2022-08-29 ENCOUNTER — Encounter: Payer: Medicare Other | Admitting: Internal Medicine

## 2022-09-02 NOTE — Telephone Encounter (Signed)
Requesting: Ambien '10mg'$   Contract: 11/27/20 UDS: None Last Visit: 03/14/22 Next Visit: 11/08/22 Last Refill: 05/27/22 #30 and 1RF   Please Advise

## 2022-09-02 NOTE — Telephone Encounter (Signed)
Rerouted to correct provider per pt call

## 2022-09-02 NOTE — Telephone Encounter (Signed)
PDMP okay, Rx sent 

## 2022-09-06 ENCOUNTER — Encounter: Payer: Medicare Other | Admitting: Internal Medicine

## 2022-09-20 ENCOUNTER — Encounter: Payer: Self-pay | Admitting: Internal Medicine

## 2022-09-23 ENCOUNTER — Ambulatory Visit (INDEPENDENT_AMBULATORY_CARE_PROVIDER_SITE_OTHER): Payer: Medicare Other

## 2022-09-23 DIAGNOSIS — I480 Paroxysmal atrial fibrillation: Secondary | ICD-10-CM

## 2022-09-24 LAB — CUP PACEART REMOTE DEVICE CHECK
Date Time Interrogation Session: 20240401063658
Implantable Pulse Generator Implant Date: 20240126

## 2022-10-09 ENCOUNTER — Other Ambulatory Visit: Payer: Self-pay | Admitting: Internal Medicine

## 2022-10-28 ENCOUNTER — Ambulatory Visit (INDEPENDENT_AMBULATORY_CARE_PROVIDER_SITE_OTHER): Payer: Medicare Other

## 2022-10-28 DIAGNOSIS — I480 Paroxysmal atrial fibrillation: Secondary | ICD-10-CM

## 2022-10-28 LAB — CUP PACEART REMOTE DEVICE CHECK
Date Time Interrogation Session: 20240504063911
Implantable Pulse Generator Implant Date: 20240126

## 2022-10-31 NOTE — Progress Notes (Signed)
Carelink Summary Report / Loop Recorder 

## 2022-11-01 ENCOUNTER — Other Ambulatory Visit: Payer: Self-pay | Admitting: Internal Medicine

## 2022-11-02 ENCOUNTER — Telehealth: Payer: Self-pay | Admitting: Internal Medicine

## 2022-11-04 ENCOUNTER — Telehealth: Payer: Self-pay | Admitting: Internal Medicine

## 2022-11-04 NOTE — Telephone Encounter (Signed)
PDMP okay, next visit 11/08/2022.  Rx sent.

## 2022-11-04 NOTE — Telephone Encounter (Signed)
Received refill from pharmacy- has been sent to provider.

## 2022-11-04 NOTE — Telephone Encounter (Signed)
Prescription Request  11/04/2022  Is this a "Controlled Substance" medicine? Yes  LOV: Visit date not found  (Pt had physical scheduled for Friday but had to be called out of town for work so his physical is now postponed to late June. Please send in refill until he is able to come back in)   What is the name of the medication or equipment? zolpidem (AMBIEN) 10 MG tablet   Have you contacted your pharmacy to request a refill? Yes   Which pharmacy would you like this sent to?  [id number] - , [place] - [address] AT [address] OF [address] [place] [address] Phone: [phone] Fax: [phone]    Patient notified that their request is being sent to the clinical staff for review and that they should receive a response within 2 business days.   Please advise at Mobile [phone] (mobile)

## 2022-11-04 NOTE — Telephone Encounter (Signed)
Requesting:Ambien 10mg   Contract:  11/27/20 [id number] only  Last Visit:  03/14/22 Next Visit: 11/08/22 Last Refill: 09/02/22 #30 and 1RF  Please Advise

## 2022-11-08 ENCOUNTER — Encounter: Payer: Medicare Other | Admitting: Internal Medicine

## 2022-11-13 ENCOUNTER — Other Ambulatory Visit: Payer: Self-pay | Admitting: Internal Medicine

## 2022-11-26 NOTE — Progress Notes (Signed)
Carelink Summary Report / Loop Recorder 

## 2022-12-02 ENCOUNTER — Ambulatory Visit (INDEPENDENT_AMBULATORY_CARE_PROVIDER_SITE_OTHER): Payer: Medicare Other

## 2022-12-02 DIAGNOSIS — I480 Paroxysmal atrial fibrillation: Secondary | ICD-10-CM

## 2022-12-02 LAB — CUP PACEART REMOTE DEVICE CHECK
Date Time Interrogation Session: 20240609231246
Implantable Pulse Generator Implant Date: 20240126

## 2022-12-04 ENCOUNTER — Telehealth: Payer: Self-pay | Admitting: Internal Medicine

## 2022-12-04 NOTE — Telephone Encounter (Signed)
Requesting: Ambien 10mg   Contract: 11/27/20 UDS: Ambien only Last Visit: 03/14/22 Next Visit: 12/13/22 Last Refill: 11/04/22 #30 and 0RF  Please Advise

## 2022-12-04 NOTE — Telephone Encounter (Signed)
PDMP okay, Rx sent 

## 2022-12-13 ENCOUNTER — Encounter: Payer: Medicare Other | Admitting: Internal Medicine

## 2022-12-24 NOTE — Progress Notes (Signed)
Carelink Summary Report / Loop Recorder 

## 2023-01-06 ENCOUNTER — Telehealth: Payer: Self-pay | Admitting: Internal Medicine

## 2023-01-06 ENCOUNTER — Ambulatory Visit: Payer: Medicare Other

## 2023-01-06 DIAGNOSIS — I480 Paroxysmal atrial fibrillation: Secondary | ICD-10-CM | POA: Diagnosis not present

## 2023-01-06 NOTE — Telephone Encounter (Signed)
Next visit is scheduled for next month.  PDMP okay, Rx sent

## 2023-01-06 NOTE — Telephone Encounter (Signed)
Requesting:Ambien 10mg   Contract: 11/27/20 [id number] only Last Visit: 03/14/22 Next Visit: 01/31/23 Last Refill: 12/04/22 #30 and 0RF   Please Advise

## 2023-01-07 LAB — CUP PACEART REMOTE DEVICE CHECK
Date Time Interrogation Session: 20240712230912
Implantable Pulse Generator Implant Date: 20240126

## 2023-01-20 IMAGING — US US EXTREM LOW VENOUS*L*
1 series · 13 of 24 positions shown · non-contrast
Comparison: None.

CLINICAL DATA: Left ankle pain, redness and swelling



[Series 1: us venous img lower uni left (dvt) · portal-venous · 13 of 40 slices shown]
[im 1/40]
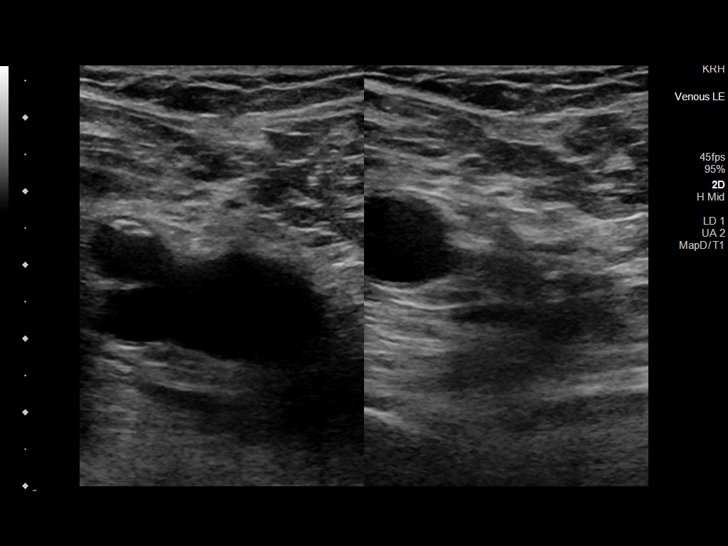
[im 4/40]
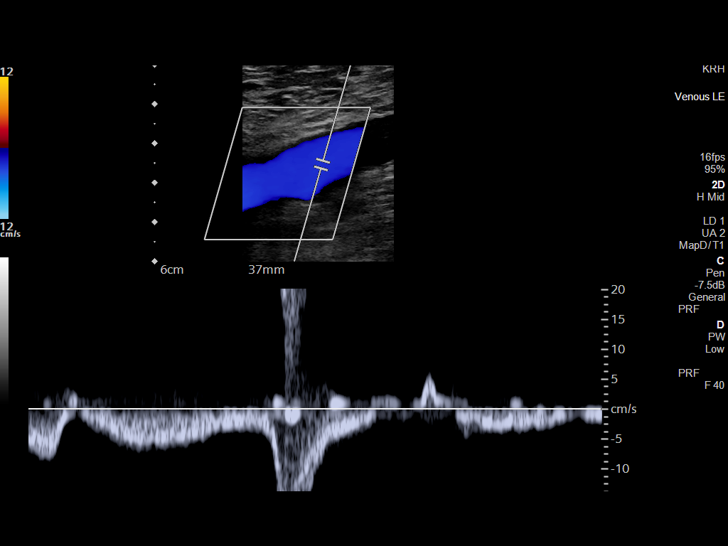
[im 7/40]
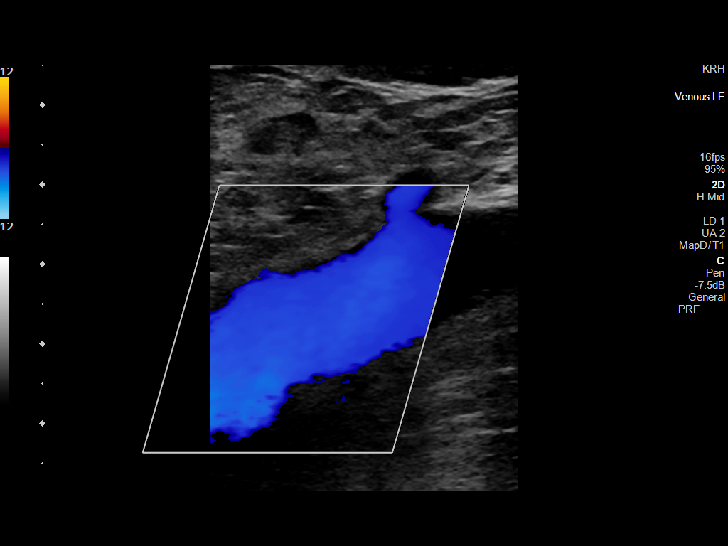
[im 11/40]
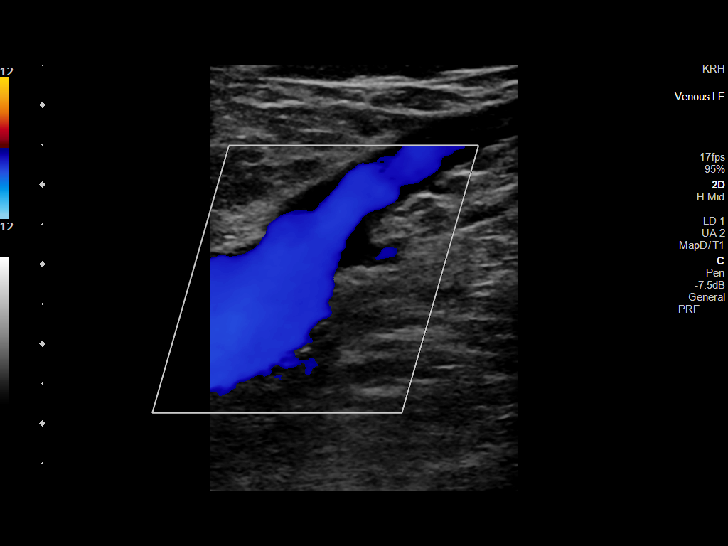
[im 14/40]
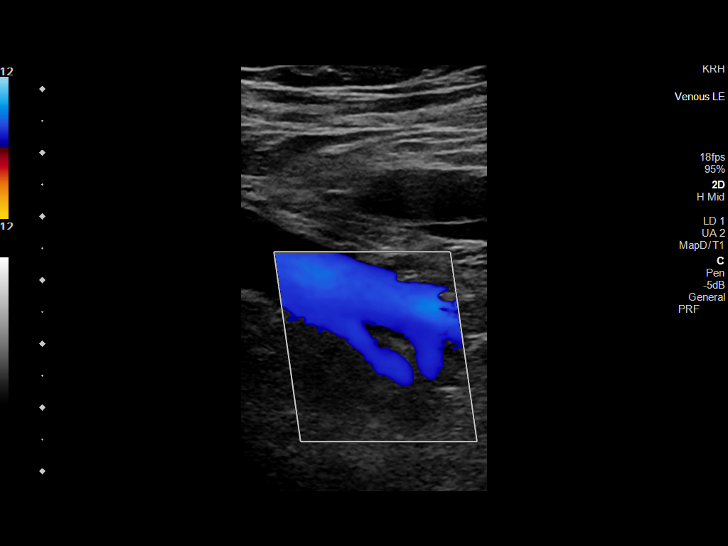
[im 17/40]
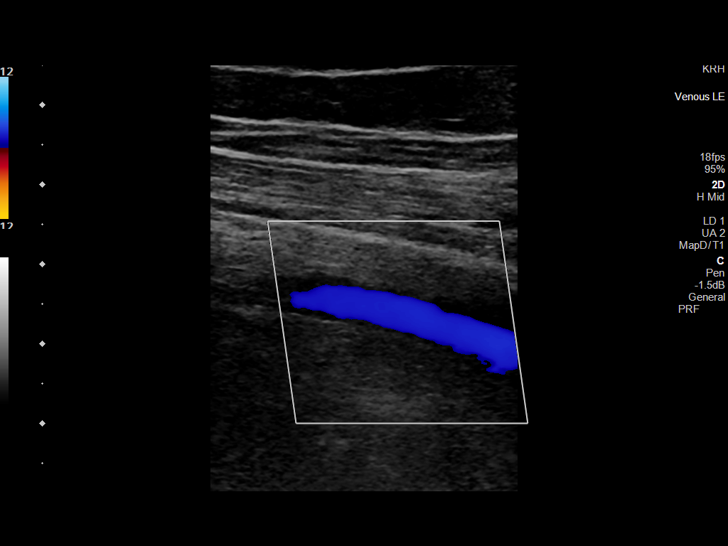
[im 21/40]
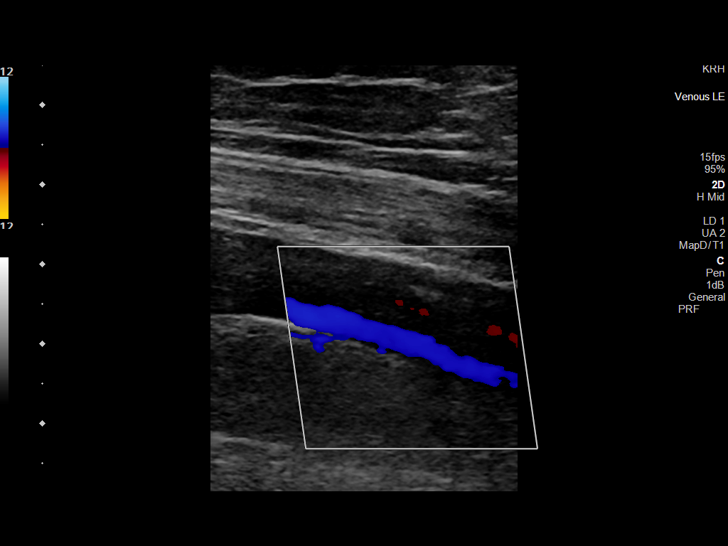
[im 23/40]
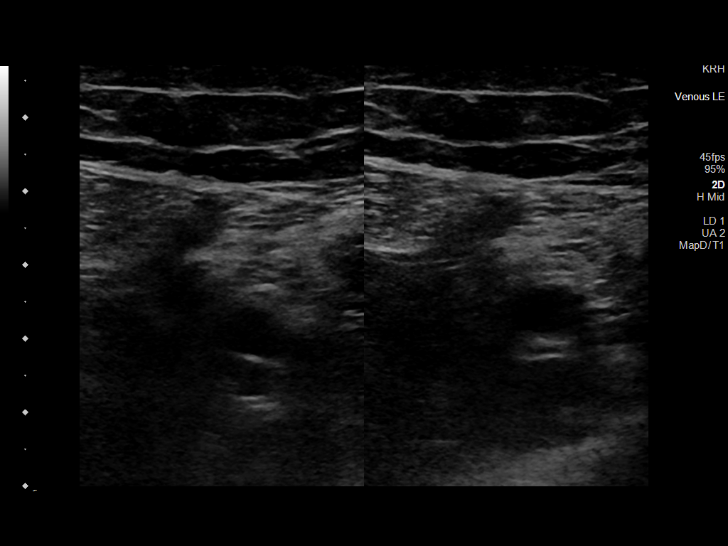
[im 26/40]
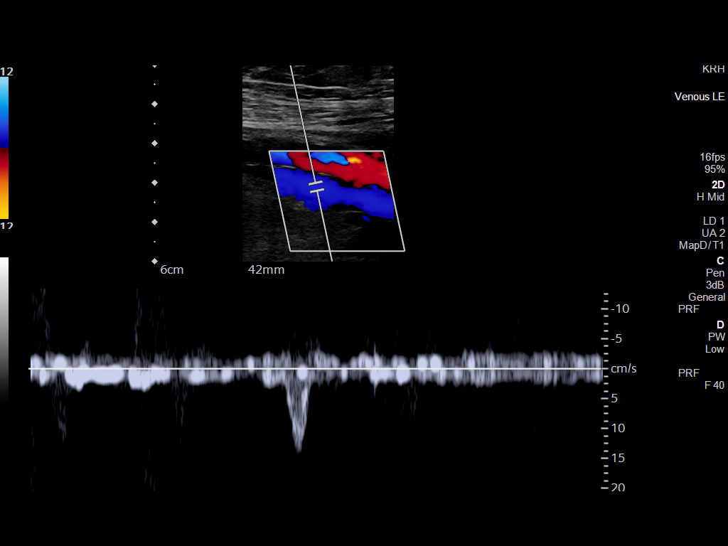
[im 29/40]
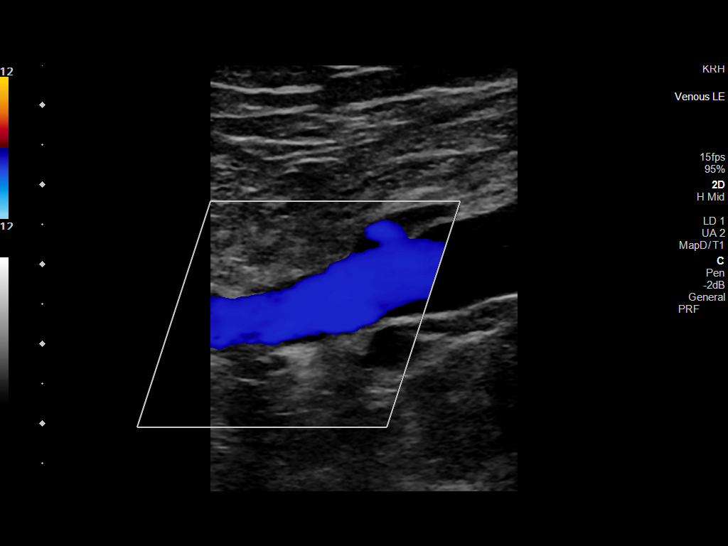
[im 33/40]
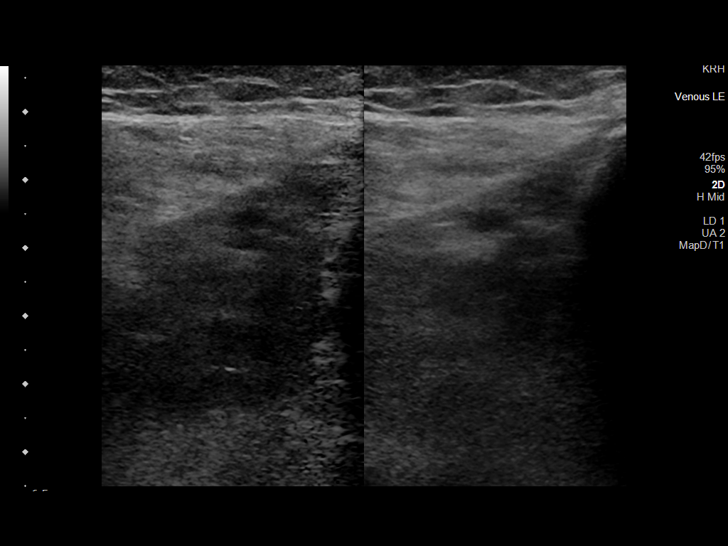
[im 36/40]
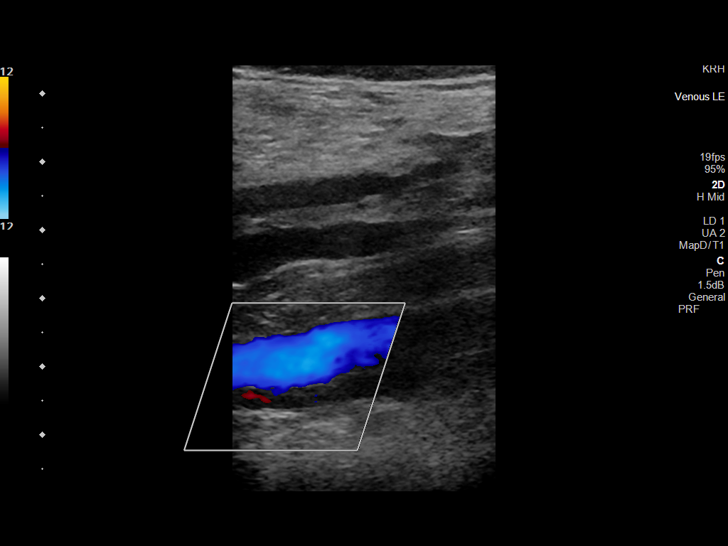
[im 40/40]
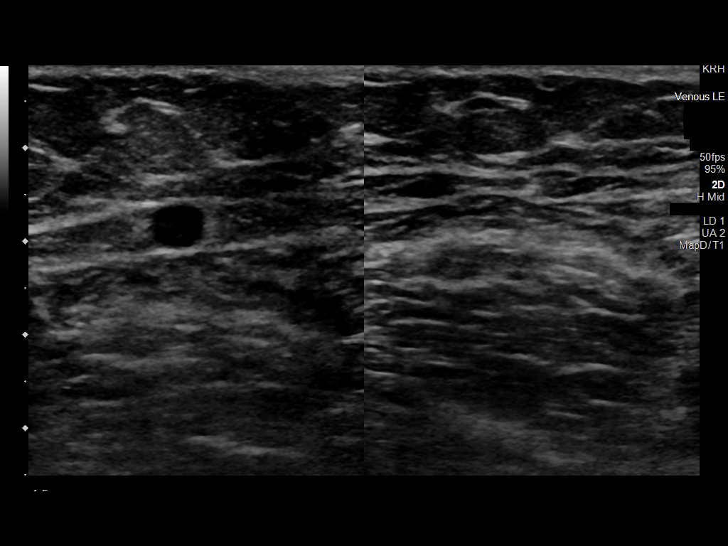

[13 of 24 positions shown; findings below may reference images not displayed]

FINDINGS: Contralateral Common Femoral Vein: Respiratory phasicity is normal
and symmetric with the symptomatic side. No evidence of thrombus.
Normal compressibility.

Common Femoral Vein: No evidence of thrombus. Normal
compressibility, respiratory phasicity and response to augmentation.

Saphenofemoral Junction: No evidence of thrombus. Normal
compressibility and flow on color Doppler imaging.

Profunda Femoral Vein: No evidence of thrombus. Normal
compressibility and flow on color Doppler imaging.

Femoral Vein: No evidence of thrombus. Normal compressibility,
respiratory phasicity and response to augmentation.

Popliteal Vein: No evidence of thrombus. Normal compressibility,
respiratory phasicity and response to augmentation.

Calf Veins: No evidence of thrombus. Normal compressibility and flow
on color Doppler imaging.

Superficial Great Saphenous Vein: No evidence of thrombus. Normal
compressibility.

Venous Reflux:  None.

Other Findings:  None.
IMPRESSION: No evidence of deep venous thrombosis.

## 2023-01-20 NOTE — Progress Notes (Signed)
Carelink Summary Report / Loop Recorder 

## 2023-01-30 ENCOUNTER — Other Ambulatory Visit: Payer: Self-pay | Admitting: Internal Medicine

## 2023-01-31 ENCOUNTER — Encounter: Payer: Self-pay | Admitting: Internal Medicine

## 2023-01-31 ENCOUNTER — Other Ambulatory Visit (HOSPITAL_BASED_OUTPATIENT_CLINIC_OR_DEPARTMENT_OTHER): Payer: Self-pay

## 2023-01-31 ENCOUNTER — Ambulatory Visit: Payer: Medicare Other | Admitting: Internal Medicine

## 2023-01-31 VITALS — BP 134/78 | HR 53 | Temp 98.0°F | Resp 19 | Ht 74.0 in | Wt 225.6 lb

## 2023-01-31 DIAGNOSIS — E785 Hyperlipidemia, unspecified: Secondary | ICD-10-CM

## 2023-01-31 DIAGNOSIS — R739 Hyperglycemia, unspecified: Secondary | ICD-10-CM

## 2023-01-31 DIAGNOSIS — Z125 Encounter for screening for malignant neoplasm of prostate: Secondary | ICD-10-CM | POA: Diagnosis not present

## 2023-01-31 DIAGNOSIS — N400 Enlarged prostate without lower urinary tract symptoms: Secondary | ICD-10-CM

## 2023-01-31 DIAGNOSIS — I10 Essential (primary) hypertension: Secondary | ICD-10-CM | POA: Diagnosis not present

## 2023-01-31 LAB — CBC WITH DIFFERENTIAL/PLATELET
Basophils Absolute: 0 10*3/uL (ref 0.0–0.1)
Basophils Relative: 0.4 % (ref 0.0–3.0)
Eosinophils Absolute: 0.3 10*3/uL (ref 0.0–0.7)
Eosinophils Relative: 3.1 % (ref 0.0–5.0)
HCT: 41.4 % (ref 39.0–52.0)
Hemoglobin: 13.4 g/dL (ref 13.0–17.0)
Lymphocytes Relative: 23.9 % (ref 12.0–46.0)
Lymphs Abs: 2 10*3/uL (ref 0.7–4.0)
MCHC: 32.5 g/dL (ref 30.0–36.0)
MCV: 97.2 fl (ref 78.0–100.0)
Monocytes Absolute: 0.6 10*3/uL (ref 0.1–1.0)
Monocytes Relative: 7.7 % (ref 3.0–12.0)
Neutro Abs: 5.4 10*3/uL (ref 1.4–7.7)
Neutrophils Relative %: 64.9 % (ref 43.0–77.0)
Platelets: 143 10*3/uL — ABNORMAL LOW (ref 150.0–400.0)
RBC: 4.26 Mil/uL (ref 4.22–5.81)
RDW: 13.5 % (ref 11.5–15.5)
WBC: 8.4 10*3/uL (ref 4.0–10.5)

## 2023-01-31 LAB — BASIC METABOLIC PANEL
BUN: 20 mg/dL (ref 6–23)
CO2: 32 mEq/L (ref 19–32)
Calcium: 9.2 mg/dL (ref 8.4–10.5)
Chloride: 98 mEq/L (ref 96–112)
Creatinine, Ser: 0.83 mg/dL (ref 0.40–1.50)
GFR: 90.92 mL/min (ref >60.00)
Glucose, Bld: 99 mg/dL (ref 70–99)
Potassium: 3.6 mEq/L (ref 3.5–5.1)
Sodium: 138 mEq/L (ref 135–145)

## 2023-01-31 LAB — LIPID PANEL
Cholesterol: 176 mg/dL (ref 0–200)
HDL: 62.4 mg/dL (ref >39.00)
LDL Cholesterol: 90 mg/dL (ref 0–99)
NonHDL: 113.9
Total CHOL/HDL Ratio: 3
Triglycerides: 122 mg/dL (ref 0.0–149.0)
VLDL: 24.4 mg/dL (ref 0.0–40.0)

## 2023-01-31 LAB — ALT: ALT: 17 U/L (ref 0–53)

## 2023-01-31 LAB — PSA: PSA: 0.86 ng/mL (ref 0.10–4.00)

## 2023-01-31 LAB — HEMOGLOBIN A1C: Hgb A1c MFr Bld: 5.8 % (ref 4.6–6.5)

## 2023-01-31 LAB — AST: AST: 20 U/L (ref 0–37)

## 2023-01-31 MED ORDER — AREXVY 120 MCG/0.5ML IM SUSR
INTRAMUSCULAR | 0 refills | Status: DC
  Filled 2023-01-31: qty 0.5, 1d supply, fill #0

## 2023-01-31 NOTE — Progress Notes (Unsigned)
Subjective:    Patient ID: EYOB WORM, male    DOB: 1956-01-08, 67 y.o.   MRN: [id number]  DOS:  01/31/2023 Type of visit - description: Follow-up  Since LOV is feeling well. Saw cardiology, note reviewed. Ambulatory BPs normal when checked History of OSA, currently not on CPAP, denies snoring, fatigue or feeling sleepy.  No chest pain or difficulty breathing  No dysuria or gross hematuria  Review of Systems See above   Past Medical History:  Diagnosis Date   Abnormal stress echo    Allergy    Anxiety    Aortic valve stenosis    Arthropathy    Atrial fibrillation (HCC)    Brachial radiculitis    Edema    Enthesopathy of ankle    Esophageal dysmotility    Gout    Hammertoe of second toe of right foot    corrected   History of heart valve replacement    HTN (hypertension)    Hx of transient ischemic attack (TIA) 12/2012   Hypercholesterolemia    Insomnia    Left ventricular hypertrophy    Obesity (BMI 30-39.9)    Orthostatic hypotension    Syncope and collapse     Past Surgical History:  Procedure Laterality Date   AORTIC VALVE REPLACEMENT  2016   ATRIAL FIBRILLATION ABLATION N/A 02/02/2021   Procedure: ATRIAL FIBRILLATION ABLATION;  Surgeon: Lanier Prude, MD;  [facility];  Service: Cardiovascular;  Laterality: N/A;   CARDIOVERSION  06/20/2015   CARDIOVERSION N/A 10/03/2020   Procedure: CARDIOVERSION;  Surgeon: Wendall Stade, MD;  Location: [facility];  Service: Cardiovascular;  Laterality: N/A;   HAMMER TOE SURGERY Right 2015   MEDIAL PARTIAL KNEE REPLACEMENT Bilateral 2020   2 procedures done 1 month apart    METATARSAL OSTEOTOMY Right 2015   R 2nd metatarsal   NASAL SEPTUM SURGERY  1996   RIGHT AND LEFT HEART CATH  02/13/2015   TEE WITHOUT CARDIOVERSION N/A 02/01/2021   Procedure: TRANSESOPHAGEAL ECHOCARDIOGRAM (TEE);  Surgeon: Wendall Stade, MD;  Location: 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, [employer] director  Tobacco Use   Smoking status: Never   Smokeless tobacco: Never  Vaping Use   Vaping status: Never Used  Substance and Sexual Activity   Alcohol use: Yes    Alcohol/week: 2.0 standard drinks of alcohol    Types: 2 Standard drinks or equivalent per week    Comment: ~ 3 times a week   Drug use: Never   Sexual activity: Not on file  Other Topics Concern   Not on file  Social History Narrative   Moved to [place] 06/2020    Household: pt and wife   Social Determinants of Corporate investment banker Strain: Not on file  Food Insecurity: Not on file  Transportation Needs: Not on file  Physical Activity: Not on file  Stress: Not on file  Social Connections: Not on file  Intimate Partner Violence: Not on file    Current Outpatient Medications  Medication Instructions   allopurinol (ZYLOPRIM) 200 mg, Oral, Daily  amLODipine (NORVASC) 5 mg, Oral, Daily   aspirin EC 81 mg, Oral, Daily, Swallow whole.   atorvastatin (LIPITOR) 40 MG tablet TAKE 1 TABLET BY MOUTH EVERYDAY AT BEDTIME   fluocinolone (VANOS) 0.01 % cream 1 application , Topical, Daily PRN   hydrochlorothiazide (HYDRODIURIL) 12.5 mg, Oral, Daily   hydrOXYzine (ATARAX) 25 mg, Oral, At bedtime PRN   metoprolol succinate (TOPROL-XL) 150 mg, Oral, Daily, Take with or immediately following a meal   Multiple Vitamin (MULTIVITAMIN WITH MINERALS) TABS tablet 1 tablet, Oral, Daily   olmesartan (BENICAR) 40 mg, Oral, Daily   RSV vaccine recomb adjuvanted (AREXVY) 120 MCG/0.5ML injection Intramuscular   tadalafil (CIALIS) 20 mg, Oral, Daily PRN   zolpidem (AMBIEN) 10 MG tablet TAKE 1/2 TO 1 TABLET BY MOUTH AT BEDTIME AS  NEEDED FOR SLEEP       Objective:   Physical Exam BP 134/78 (BP Location: Left Arm, Patient Position: Sitting, Cuff Size: Large)   Pulse (!) 53   Temp 98 F (36.7 C) (Oral)   Resp 19   Ht 6\' 2"  (1.88 m)   Wt 225 lb 9.6 oz (102.3 kg)   SpO2 98%   BMI 28.97 kg/m  General:   Well developed, NAD, BMI noted.  HEENT:  Normocephalic . Face symmetric, atraumatic Lungs:  CTA B Normal respiratory effort, no intercostal retractions, no accessory muscle use. Heart: RRR,  no murmur.  Abdomen:  Not distended, soft, non-tender. No rebound or rigidity.   Skin: Not pale. Not jaundice Lower extremities: no pretibial edema bilaterally  Neurologic:  alert & oriented X3.  Speech normal, gait appropriate for age and unassisted Psych--  Cognition and judgment appear intact.  Cooperative with normal attention span and concentration.  Behavior appropriate. No anxious or depressed appearing.    Assessment     Assessment (new patient 08/2020, referred by Dr. Eden Emms) DM: A1c 6.5 (June 2022) HTN High cholesterol Insomnia  CV: --AVR, bioprosthetic, 2018 in [facility]. --Postop A. Fib >>> on  A. Fib again ~ 05-2020  --No CAD --Ablation 01-2021 Gout  OSA : better after uvulectomy  (~ 2000s).  sleep study 05/2021, moderate was Rx a CPAP Dry skin: rarely use topical steroids   PLAN ROV DM: Currently diet controlled.  Check A1c HTN: On amlodipine, HCTZ, metoprolol, olmesartan.  Ambulatory BPs check sometimes, normal.  Checking labs. High cholesterol: Lipitor 40 mg.  Check a FLP AST ALT.  He is very active at work. Insomnia: On Ambien as needed Cardiovascular: Saw cardiology 07/19/2022, no recurrence od  A Fib since ablation 2022, anticoagulation stopped, started aspirin 81 mg, plan for a  implantable loop recorder. OSA: Had a + sleep study 2022, was recommended a CPAP but does not use it.  Explained patient the risk of untreated OSA, he is extremely reluctant to do because he does not snore,  has good energy is not sleepy.  Will revisit the issue from time to time. Preventive care reviewed --Tdap: 03-2021 - PNM 20: 08/20/2021 - s/p shingrix - vaccines I recommend: COVID booster if not done recently, flu shot every fall, RSV. - Prostate cancer screening: r/o  BPH, no symptoms, check PSA -CCS: Had a colonoscopy 06/20/2008 Had a colonoscopy 05/05/2019, 1 polyp reviewed, pathology: Tubular adenoma, negative for high-grade dysplasia. Next? Refer to GI next year - ACP discussed RTC 6 months

## 2023-01-31 NOTE — Patient Instructions (Addendum)
This is a summary of the advice provided today.  Please read if you have questions about today's visit.  You can always call us back for more information.  If you have MyChart, please use it. Let the APP send you alerts and  reminders.   If you are not planning to use MyChart, please deactivate it.  If you have it and don't use it, you won't see my comments about your results.  If I order blood work, x-rays or referrals and you do not see your results or don't hear about the referrals within a week:  please call my office.    Vaccines I recommend: COVID booster if not done recently, flu shot every fall, RSV.  Check the  blood pressure regularly Blood pressure goal:  between 110/65 and  135/85. If it is consistently higher or lower, let me know      GO TO THE LAB : Get the blood work     GO TO THE FRONT DESK, PLEASE SCHEDULE YOUR APPOINTMENTS Come back for a checkup in 6 months      "Health Care Power of attorney" ,  "Living will" (Advance care planning documents)  If you already have a living will or healthcare power of attorney, is recommended you bring the copy to be scanned in your chart.   The document will be available to all the doctors you see in the system.  Advance care planning is a process that supports adults in  understanding and sharing their preferences regarding future medical care.  The patient's preferences are recorded in documents called Advance Directives and the can be modified at any time while the patient is in full mental capacity.   If you don't have one, please consider create one.      More information at: [email or web address]

## 2023-02-02 ENCOUNTER — Encounter: Payer: Self-pay | Admitting: Internal Medicine

## 2023-02-02 NOTE — Assessment & Plan Note (Signed)
  Preventive care reviewed --Tdap: 03-2021 - PNM 20: 08/20/2021 - s/p shingrix - vaccines I recommend: COVID booster if not done recently, flu shot every fall, RSV. - Prostate cancer screening: r/o  BPH, no symptoms, check PSA -CCS: Had a colonoscopy 06/20/2008 Had a colonoscopy 05/05/2019, 1 polyp reviewed, pathology: Tubular adenoma, negative for high-grade dysplasia. Next? Refer to GI next year - ACP discussed

## 2023-02-02 NOTE — Assessment & Plan Note (Signed)
ROV DM: Currently diet controlled.  Check A1c HTN: On amlodipine, HCTZ, metoprolol, olmesartan.  Ambulatory BPs check sometimes, normal.  Checking labs. High cholesterol: Lipitor 40 mg.  Check a FLP AST ALT.  He is very active at work. Insomnia: On Ambien as needed Cardiovascular: Saw cardiology 07/19/2022, no recurrence od  A Fib since ablation 2022, anticoagulation stopped, started aspirin 81 mg, plan for a  implantable loop recorder. OSA: Had a + sleep study 2022, was recommended a CPAP but does not use it.  Explained patient the risk of untreated OSA, he is extremely reluctant to do because he does not snore, has good energy is not sleepy.  Will revisit the issue from time to time. Preventive care reviewed RTC 6 months

## 2023-02-04 ENCOUNTER — Telehealth: Payer: Self-pay | Admitting: Internal Medicine

## 2023-02-04 NOTE — Telephone Encounter (Signed)
Medication: zolpidem (AMBIEN) 10 MG tablet   fluocinolone (VANOS) 0.01 % cream  Has the patient contacted their pharmacy? Yes.     Preferred Pharmacy:  [id number] - [place], [place] - [address] AT [facility] [address] [place] [address] Phone: [phone]  Fax: [phone]

## 2023-02-05 NOTE — Telephone Encounter (Signed)
Requesting: Ambien Contract: 11/27/20 UDS: N/A Last OV:  01/31/23 Next OV: 08/01/23 Last Refill: 01/06/23, #28--0 RF Database:   Please advise

## 2023-02-05 NOTE — Addendum Note (Signed)
Addended by: Roxanne Gates on: 02/05/2023 10:30 AM   Modules accepted: Orders

## 2023-02-06 NOTE — Telephone Encounter (Signed)
Pt is calling back to see about getting the ambien filled, also requesting eczema cream.

## 2023-02-07 MED ORDER — FLUOCINOLONE ACETONIDE 0.01 % EX CREA: 1.0000 "application " | TOPICAL_CREAM | Freq: Every day | CUTANEOUS | 1 refills | Status: DC | PRN

## 2023-02-07 MED ORDER — ZOLPIDEM TARTRATE 10 MG PO TABS: ORAL_TABLET | ORAL | 3 refills | Status: DC

## 2023-02-07 NOTE — Addendum Note (Signed)
Addended by: Wanda Plump on: 02/07/2023 08:49 AM   Modules accepted: Orders

## 2023-02-07 NOTE — Telephone Encounter (Signed)
Sent.  PDMP okay

## 2023-02-10 ENCOUNTER — Ambulatory Visit (INDEPENDENT_AMBULATORY_CARE_PROVIDER_SITE_OTHER): Payer: Medicare Other

## 2023-02-10 DIAGNOSIS — I480 Paroxysmal atrial fibrillation: Secondary | ICD-10-CM | POA: Diagnosis not present

## 2023-02-11 LAB — CUP PACEART REMOTE DEVICE CHECK
Date Time Interrogation Session: 20240818231842
Implantable Pulse Generator Implant Date: 20240126

## 2023-02-13 ENCOUNTER — Other Ambulatory Visit: Payer: Self-pay | Admitting: Internal Medicine

## 2023-02-20 NOTE — Progress Notes (Signed)
Carelink Summary Report / Loop Recorder 

## 2023-03-17 ENCOUNTER — Ambulatory Visit (INDEPENDENT_AMBULATORY_CARE_PROVIDER_SITE_OTHER): Payer: Medicare Other

## 2023-03-17 DIAGNOSIS — I480 Paroxysmal atrial fibrillation: Secondary | ICD-10-CM

## 2023-03-18 LAB — CUP PACEART REMOTE DEVICE CHECK
Date Time Interrogation Session: 20240920230746
Implantable Pulse Generator Implant Date: 20240126

## 2023-03-31 NOTE — Progress Notes (Signed)
Carelink Summary Report / Loop Recorder 

## 2023-04-02 ENCOUNTER — Other Ambulatory Visit: Payer: Self-pay | Admitting: Internal Medicine

## 2023-04-10 ENCOUNTER — Encounter: Payer: Self-pay | Admitting: Physician Assistant

## 2023-04-10 ENCOUNTER — Ambulatory Visit: Payer: Medicare Other | Admitting: Physician Assistant

## 2023-04-10 VITALS — BP 138/88 | HR 51 | Temp 97.7°F | Ht 74.0 in | Wt 228.5 lb

## 2023-04-10 DIAGNOSIS — M25561 Pain in right knee: Secondary | ICD-10-CM

## 2023-04-10 NOTE — Progress Notes (Signed)
Established patient visit   Patient: Cameron Carey   DOB: [birth year]   67 y.o. Male  MRN: [id number] Visit Date: 04/10/2023  Today's healthcare provider: Alfredia Ferguson, PA-C   Chief Complaint  Patient presents with   Knee Pain    States pain has been about a week. Was swollen and red, hot too touch. Took some Alive. No injury. He has had knee surgery in past, replacements.   Subjective     Pt reports Saturday, x 5 days ago, his right leg all of a sudden was painful, swollen, red, and warm. This pain persisted through Monday, where it started to improve. H/o right TKR around 5 years ago. Denies injury, wound. Took some alleve and used ice/heat.  Pt reports the day before he took both a flu and covid vaccine. No other changes. Today only some pain when walking up stairs, some pressure front of the knee when he bends it.   Medications: Outpatient Medications Prior to Visit  Medication Sig   allopurinol (ZYLOPRIM) 100 MG tablet Take 2 tablets (200 mg total) by mouth daily.   amLODipine (NORVASC) 5 MG tablet Take 1 tablet (5 mg total) by mouth daily.   aspirin EC 81 MG tablet Take 1 tablet (81 mg total) by mouth daily. Swallow whole.   atorvastatin (LIPITOR) 40 MG tablet TAKE 1 TABLET BY MOUTH EVERYDAY AT BEDTIME   fluocinolone 0.01 % cream Apply 1 application  topically daily as needed (dry skin).   hydrochlorothiazide (HYDRODIURIL) 12.5 MG tablet Take 1 tablet (12.5 mg total) by mouth daily.   hydrOXYzine (ATARAX/VISTARIL) 25 MG tablet Take 25 mg by mouth at bedtime as needed for itching.   metoprolol succinate (TOPROL-XL) 100 MG 24 hr tablet Take 1.5 tablets (150 mg total) by mouth daily. Take with or immediately following a meal   Multiple Vitamin (MULTIVITAMIN WITH MINERALS) TABS tablet Take 1 tablet by mouth daily.   olmesartan (BENICAR) 40 MG tablet Take 1 tablet (40 mg total) by mouth daily.   RSV vaccine recomb adjuvanted (AREXVY) 120 MCG/0.5ML injection Inject into the  muscle.   tadalafil (CIALIS) 20 MG tablet Take 1 tablet (20 mg total) by mouth daily as needed for erectile dysfunction.   zolpidem (AMBIEN) 10 MG tablet TAKE 1/2 TO 1 TABLET BY MOUTH AT BEDTIME AS NEEDED FOR SLEEP   No facility-administered medications prior to visit.    Review of Systems  Constitutional:  Negative for fatigue and fever.  Respiratory:  Negative for cough and shortness of breath.   Cardiovascular:  Negative for chest pain, palpitations and leg swelling.  Musculoskeletal:  Positive for arthralgias and joint swelling.  Neurological:  Negative for dizziness and headaches.       Objective    BP 138/88   Pulse (!) 51   Temp 97.7 F (36.5 C)   Ht 6\' 2"  (1.88 m)   Wt 228 lb 8 oz (103.6 kg)   SpO2 98%   BMI 29.34 kg/m    Physical Exam Vitals reviewed.  Constitutional:      Appearance: He is not ill-appearing.  HENT:     Head: Normocephalic.  Eyes:     Conjunctiva/sclera: Conjunctivae normal.  Cardiovascular:     Rate and Rhythm: Normal rate.  Pulmonary:     Effort: Pulmonary effort is normal. No respiratory distress.  Musculoskeletal:     Comments: Right knee and LE without edema, right knee w/o effusion, erythema, no excessive warmth. No joint instability.  Neurological:     General: No focal deficit present.     Mental Status: He is alert and oriented to person, place, and time.  Psychiatric:        Mood and Affect: Mood normal.        Behavior: Behavior normal.      No results found for any visits on 04/10/23.  Assessment & Plan    Acute pain of right knee  Possible arthritic flare post vaccination  Pt treated appropriately w/ nsaids , elevation, ice.  Advised if reoccurs in a short time period to call office for further recommendations  Return if symptoms worsen or fail to improve.       Alfredia Ferguson, PA-C  [facility] at [facility] [phone] (phone) [phone] (fax)  [facility]

## 2023-04-21 ENCOUNTER — Ambulatory Visit (INDEPENDENT_AMBULATORY_CARE_PROVIDER_SITE_OTHER): Payer: Medicare Other

## 2023-04-21 DIAGNOSIS — I480 Paroxysmal atrial fibrillation: Secondary | ICD-10-CM

## 2023-04-22 LAB — CUP PACEART REMOTE DEVICE CHECK
Date Time Interrogation Session: 20241027231531
Implantable Pulse Generator Implant Date: 20240126

## 2023-05-12 NOTE — Progress Notes (Signed)
Carelink Summary Report / Loop Recorder 

## 2023-05-14 ENCOUNTER — Other Ambulatory Visit: Payer: Self-pay | Admitting: Internal Medicine

## 2023-05-14 MED ORDER — FLUOCINOLONE ACETONIDE 0.01 % EX CREA: 1.0000 "application " | TOPICAL_CREAM | Freq: Every day | CUTANEOUS | 3 refills | Status: DC | PRN

## 2023-05-14 MED ORDER — HYDROXYZINE HCL 25 MG PO TABS: 25.0000 mg | ORAL_TABLET | Freq: Every evening | ORAL | 0 refills | Status: DC | PRN

## 2023-05-14 MED ORDER — AMLODIPINE BESYLATE 5 MG PO TABS: 5.0000 mg | ORAL_TABLET | Freq: Every day | ORAL | 1 refills | Status: DC

## 2023-05-14 NOTE — Telephone Encounter (Signed)
Pt requesting refill on hydroxyzine 25mg - he uses at bedtime prn for itching. I don't see that you have ever prescribed this for him. Okay to send?

## 2023-05-14 NOTE — Addendum Note (Signed)
Addended by: Conrad [place] D on: 05/14/2023 10:28 AM   Modules accepted: Orders

## 2023-05-14 NOTE — Telephone Encounter (Signed)
Patient called and would like med refills on following meds:  hydrOXYzine (ATARAX/VISTARIL) 25 MG tablet    fluocinolone 0.01 % cream   amLODipine (NORVASC) 5 MG tablet   Please send to [facility]

## 2023-05-14 NOTE — Telephone Encounter (Signed)
Okay to send #30, no refills, will talk about Atarax at the next opportunity

## 2023-05-14 NOTE — Addendum Note (Signed)
Addended byConrad [place] D on: 05/14/2023 08:58 AM   Modules accepted: Orders

## 2023-05-25 LAB — CUP PACEART REMOTE DEVICE CHECK
Date Time Interrogation Session: 20241129230645
Implantable Pulse Generator Implant Date: 20240126

## 2023-05-26 ENCOUNTER — Ambulatory Visit (INDEPENDENT_AMBULATORY_CARE_PROVIDER_SITE_OTHER): Payer: Medicare Other

## 2023-05-26 DIAGNOSIS — I480 Paroxysmal atrial fibrillation: Secondary | ICD-10-CM | POA: Diagnosis not present

## 2023-05-29 ENCOUNTER — Telehealth: Payer: Self-pay | Admitting: Internal Medicine

## 2023-05-29 NOTE — Telephone Encounter (Signed)
Pdmp checked, rx sent

## 2023-05-29 NOTE — Telephone Encounter (Signed)
Requesting: Ambien 10mg   Contract: 12/06/20 UDS: Ambien only Last Visit: 01/31/23 Next Visit: 08/01/23 Last Refill: 02/07/23 #28 and 3RF   Please Advise

## 2023-06-05 ENCOUNTER — Other Ambulatory Visit: Payer: Self-pay | Admitting: Internal Medicine

## 2023-06-24 ENCOUNTER — Telehealth: Payer: Self-pay | Admitting: Internal Medicine

## 2023-06-24 NOTE — Telephone Encounter (Signed)
Requesting: Ambien 10mg   Contract: None UDS: Ambien only Last Visit: 01/31/23 Next Visit: 08/01/23 Last Refill: 05/29/23 #28 and 0RF   Please Advise

## 2023-06-24 NOTE — Telephone Encounter (Signed)
PDMP okay, RF sent 

## 2023-06-30 ENCOUNTER — Ambulatory Visit (INDEPENDENT_AMBULATORY_CARE_PROVIDER_SITE_OTHER): Payer: Medicare Other

## 2023-06-30 DIAGNOSIS — I480 Paroxysmal atrial fibrillation: Secondary | ICD-10-CM | POA: Diagnosis not present

## 2023-07-01 LAB — CUP PACEART REMOTE DEVICE CHECK
Date Time Interrogation Session: 20250105231516
Implantable Pulse Generator Implant Date: 20240126

## 2023-08-01 ENCOUNTER — Ambulatory Visit: Payer: Medicare Other | Admitting: Internal Medicine

## 2023-08-04 ENCOUNTER — Ambulatory Visit (INDEPENDENT_AMBULATORY_CARE_PROVIDER_SITE_OTHER): Payer: Medicare Other

## 2023-08-04 DIAGNOSIS — I480 Paroxysmal atrial fibrillation: Secondary | ICD-10-CM | POA: Diagnosis not present

## 2023-08-04 LAB — CUP PACEART REMOTE DEVICE CHECK
Date Time Interrogation Session: 20250209231942
Implantable Pulse Generator Implant Date: 20240126

## 2023-08-07 ENCOUNTER — Encounter: Payer: Self-pay | Admitting: Cardiology

## 2023-08-11 NOTE — Addendum Note (Signed)
Addended by: Geralyn Flash D on: 08/11/2023 11:42 AM   Modules accepted: Orders

## 2023-08-11 NOTE — Progress Notes (Signed)
 [facility] Summary Report / Loop Recorder

## 2023-08-13 ENCOUNTER — Ambulatory Visit: Payer: Medicare Other | Admitting: Internal Medicine

## 2023-08-26 ENCOUNTER — Other Ambulatory Visit: Payer: Self-pay

## 2023-08-27 ENCOUNTER — Ambulatory Visit: Payer: Medicare Other | Admitting: Internal Medicine

## 2023-08-27 ENCOUNTER — Encounter: Payer: Self-pay | Admitting: Internal Medicine

## 2023-08-27 VITALS — BP 132/74 | HR 52 | Temp 97.7°F | Resp 16 | Ht 74.0 in | Wt 228.5 lb

## 2023-08-27 DIAGNOSIS — M109 Gout, unspecified: Secondary | ICD-10-CM | POA: Diagnosis not present

## 2023-08-27 DIAGNOSIS — E119 Type 2 diabetes mellitus without complications: Secondary | ICD-10-CM

## 2023-08-27 DIAGNOSIS — I10 Essential (primary) hypertension: Secondary | ICD-10-CM

## 2023-08-27 DIAGNOSIS — E785 Hyperlipidemia, unspecified: Secondary | ICD-10-CM

## 2023-08-27 LAB — BASIC METABOLIC PANEL
BUN: 23 mg/dL (ref 6–23)
CO2: 33 meq/L — ABNORMAL HIGH (ref 19–32)
Calcium: 9.7 mg/dL (ref 8.4–10.5)
Chloride: 103 meq/L (ref 96–112)
Creatinine, Ser: 0.92 mg/dL (ref 0.40–1.50)
GFR: 86.07 mL/min (ref >60.00)
Glucose, Bld: 115 mg/dL — ABNORMAL HIGH (ref 70–99)
Potassium: 4.8 meq/L (ref 3.5–5.1)
Sodium: 141 meq/L (ref 135–145)

## 2023-08-27 LAB — URIC ACID: Uric Acid, Serum: 4.8 mg/dL (ref 4.0–7.8)

## 2023-08-27 LAB — MICROALBUMIN / CREATININE URINE RATIO
Creatinine,U: 141.1 mg/dL
Microalb Creat Ratio: 9 mg/g (ref 0.0–30.0)
Microalb, Ur: 1.3 mg/dL (ref 0.0–1.9)

## 2023-08-27 LAB — HM DIABETES EYE EXAM

## 2023-08-27 NOTE — Progress Notes (Unsigned)
 Subjective:    Patient ID: Cameron Carey, male    DOB: 1956/06/06, 68 y.o.   MRN: [id number]  DOS:  08/27/2023 Type of visit - description: Routine visit  Routine checkup, feeling great, has no concerns.   Review of Systems See above   Past Medical History:  Diagnosis Date   Abnormal stress echo    Allergy    Anxiety    Aortic valve stenosis    Arthropathy    Atrial fibrillation (HCC)    Brachial radiculitis    Edema    Enthesopathy of ankle    Esophageal dysmotility    Gout    Hammertoe of second toe of right foot    corrected   History of heart valve replacement    HTN (hypertension)    Hx of transient ischemic attack (TIA) 12/2012   Hypercholesterolemia    Insomnia    Left ventricular hypertrophy    Obesity (BMI 30-39.9)    Orthostatic hypotension    Syncope and collapse     Past Surgical History:  Procedure Laterality Date   AORTIC VALVE REPLACEMENT  2016   ATRIAL FIBRILLATION ABLATION N/A 02/02/2021   Procedure: ATRIAL FIBRILLATION ABLATION;  Surgeon: Lanier Prude, MD;  Location: [facility];  Service: Cardiovascular;  Laterality: N/A;   CARDIOVERSION  06/20/2015   CARDIOVERSION N/A 10/03/2020   Procedure: CARDIOVERSION;  Surgeon: Wendall Stade, MD;  Location: [facility] ENDOSCOPY;  Service: Cardiovascular;  Laterality: N/A;   HAMMER TOE SURGERY Right 2015   MEDIAL PARTIAL KNEE REPLACEMENT Bilateral 2020   2 procedures done 1 month apart    METATARSAL OSTEOTOMY Right 2015   R 2nd metatarsal   NASAL SEPTUM SURGERY  1996   RIGHT AND LEFT HEART CATH  02/13/2015   TEE WITHOUT CARDIOVERSION N/A 02/01/2021   Procedure: TRANSESOPHAGEAL ECHOCARDIOGRAM (TEE);  Surgeon: Wendall Stade, MD;  Location: [facility] ENDOSCOPY;  Service: Cardiovascular;  Laterality: N/A;   TONSILLECTOMY     UVULECTOMY      Current Outpatient Medications  Medication Instructions   allopurinol (ZYLOPRIM) 200 mg, Oral, Daily   amLODipine (NORVASC) 5 mg, Oral, Daily   aspirin EC 81 mg,  Oral, Daily, Swallow whole.   atorvastatin (LIPITOR) 40 MG tablet TAKE 1 TABLET BY MOUTH EVERYDAY AT BEDTIME   fluocinolone 0.01 % cream 1 application , Topical, Daily PRN   hydrochlorothiazide (HYDRODIURIL) 12.5 mg, Oral, Daily   hydrOXYzine (ATARAX) 25 MG tablet TAKE 1 TABLET BY MOUTH AT BEDTIME AS NEEDED FOR ITCHING.   metoprolol succinate (TOPROL-XL) 150 mg, Oral, Daily, Take with or immediately following a meal   Multiple Vitamin (MULTIVITAMIN WITH MINERALS) TABS tablet 1 tablet, Daily   olmesartan (BENICAR) 40 mg, Oral, Daily   tadalafil (CIALIS) 20 mg, Oral, Daily PRN   zolpidem (AMBIEN) 5-10 mg, Oral, At bedtime PRN, for sleep       Objective:   Physical Exam BP 132/74   Pulse (!) 52   Temp 97.7 F (36.5 C) (Oral)   Resp 16   Ht 6\' 2"  (1.88 m)   Wt 228 lb 8 oz (103.6 kg)   SpO2 96%   BMI 29.34 kg/m  General:   Well developed, NAD, BMI noted. HEENT:  Normocephalic . Face symmetric, atraumatic Lungs:  CTA B Normal respiratory effort, no intercostal retractions, no accessory muscle use. Heart: RRR,  no murmur.  Lower extremities: no pretibial edema bilaterally  Skin: Not pale. Not jaundice Neurologic:  alert & oriented X3.  Speech normal, gait appropriate for age and unassisted Psych--  Cognition and judgment appear intact.  Cooperative with normal attention span and concentration.  Behavior appropriate. No anxious or depressed appearing.      Assessment     Assessment (new patient 08/2020, referred by Dr. Eden Emms) DM: A1c 6.5 (June 2022) HTN High cholesterol Insomnia  CV: --AVR, bioprosthetic, 2018 in [place]. --Postop A. Fib >>> on  A. Fib again ~ 05-2020  --No CAD --Ablation 01-2021 Gout  OSA : better after uvulectomy  (~ 2000s).  sleep study 05/2021, moderate was Rx a CPAP Dry skin: rarely use topical steroids   PLAN DM: He continues to have a excellent lifestyle, eating healthy, very active.  Last A1c very good on diet control. HTN: BP today  is normal, reports normal ambulatory BPs.  Continue amlodipine, HCTZ, metoprolol, Benicar.  Check a BMP. History of gout: No recent episodes, check uric acid. Insomnia: On Ambien, contract signed. Preventive care: Had a flu and COVID-vaccine. Next colonoscopy is likely 04-2024, gi referral RTC. RTC 6 months

## 2023-08-27 NOTE — Patient Instructions (Addendum)
   Check the  blood pressure regularly Blood pressure goal:  between 110/65 and  135/85. If it is consistently higher or lower, let me know     GO TO THE LAB : Get the blood work     Please go to the front desk and schedule the following: A routine checkup in 6 months   Per our records you are due for your diabetic eye exam. Please contact your eye doctor to schedule an appointment. Please have them send copies of your office visit notes to [place]. Our fax number is [phone]. If you need a referral to an eye doctor please let us know.

## 2023-08-28 NOTE — Assessment & Plan Note (Signed)
 DM: He continues to have a excellent lifestyle, eating healthy, very active.  Last A1c very good on diet control. HTN: BP today is normal, reports normal ambulatory BPs.  Continue amlodipine, HCTZ, metoprolol, Benicar.  Check a BMP. History of gout: No recent episodes, check uric acid. Insomnia: On Ambien, contract signed. Preventive care: Had a flu and COVID-vaccine. Next colonoscopy is likely 04-2024, gi referral RTC. RTC 6 months

## 2023-08-31 ENCOUNTER — Encounter: Payer: Self-pay | Admitting: Internal Medicine

## 2023-09-08 ENCOUNTER — Ambulatory Visit (INDEPENDENT_AMBULATORY_CARE_PROVIDER_SITE_OTHER): Payer: Medicare Other

## 2023-09-08 DIAGNOSIS — I480 Paroxysmal atrial fibrillation: Secondary | ICD-10-CM

## 2023-09-09 ENCOUNTER — Other Ambulatory Visit: Payer: Self-pay | Admitting: Internal Medicine

## 2023-09-09 LAB — CUP PACEART REMOTE DEVICE CHECK
Date Time Interrogation Session: 20250316231640
Implantable Pulse Generator Implant Date: 20240126

## 2023-09-11 NOTE — Progress Notes (Signed)
 [facility] Summary Report / Loop Recorder

## 2023-09-14 ENCOUNTER — Encounter: Payer: Self-pay | Admitting: Cardiology

## 2023-09-20 ENCOUNTER — Telehealth: Payer: Self-pay | Admitting: Internal Medicine

## 2023-09-22 NOTE — Telephone Encounter (Signed)
 Requesting: zolpidem 10mg  Contract: Yes UDS:  o Last Visit: 08/27/2023 Next Visit: 03/03/2024 Last Refill: 06/24/23  Please Advise

## 2023-09-22 NOTE — Telephone Encounter (Signed)
 PDMP okay, Rx sent

## 2023-09-26 ENCOUNTER — Other Ambulatory Visit: Payer: Self-pay | Admitting: Internal Medicine

## 2023-09-29 ENCOUNTER — Encounter: Payer: Self-pay | Admitting: Internal Medicine

## 2023-10-04 ENCOUNTER — Other Ambulatory Visit: Payer: Self-pay | Admitting: Internal Medicine

## 2023-10-13 ENCOUNTER — Ambulatory Visit (INDEPENDENT_AMBULATORY_CARE_PROVIDER_SITE_OTHER): Payer: Medicare Other

## 2023-10-13 DIAGNOSIS — I480 Paroxysmal atrial fibrillation: Secondary | ICD-10-CM | POA: Diagnosis not present

## 2023-10-14 ENCOUNTER — Encounter: Payer: Self-pay | Admitting: Cardiology

## 2023-10-14 LAB — CUP PACEART REMOTE DEVICE CHECK
Date Time Interrogation Session: 20250420231835
Implantable Pulse Generator Implant Date: 20240126

## 2023-10-27 NOTE — Progress Notes (Signed)
 [facility] Summary Report / Loop Recorder

## 2023-10-27 NOTE — Addendum Note (Signed)
 Addended by: Edra Govern D on: 10/27/2023 02:46 PM   Modules accepted: Orders

## 2023-11-18 ENCOUNTER — Ambulatory Visit: Payer: Medicare Other

## 2023-11-18 DIAGNOSIS — I480 Paroxysmal atrial fibrillation: Secondary | ICD-10-CM | POA: Diagnosis not present

## 2023-11-19 ENCOUNTER — Ambulatory Visit: Payer: Self-pay | Admitting: Cardiology

## 2023-11-19 LAB — CUP PACEART REMOTE DEVICE CHECK
Date Time Interrogation Session: 20250526232746
Implantable Pulse Generator Implant Date: 20240126

## 2023-12-03 NOTE — Progress Notes (Signed)
 [facility] Summary Report / Loop Recorder

## 2023-12-03 NOTE — Addendum Note (Signed)
 Addended by: Edra Govern D on: 12/03/2023 09:55 AM   Modules accepted: Orders

## 2023-12-12 ENCOUNTER — Other Ambulatory Visit: Payer: Self-pay | Admitting: Internal Medicine

## 2023-12-18 ENCOUNTER — Telehealth: Payer: Self-pay | Admitting: Internal Medicine

## 2023-12-18 ENCOUNTER — Ambulatory Visit: Payer: Self-pay | Admitting: Cardiology

## 2023-12-18 ENCOUNTER — Ambulatory Visit (INDEPENDENT_AMBULATORY_CARE_PROVIDER_SITE_OTHER)

## 2023-12-18 DIAGNOSIS — I480 Paroxysmal atrial fibrillation: Secondary | ICD-10-CM | POA: Diagnosis not present

## 2023-12-18 LAB — CUP PACEART REMOTE DEVICE CHECK
Date Time Interrogation Session: 20250625234235
Implantable Pulse Generator Implant Date: 20240126

## 2023-12-18 NOTE — Telephone Encounter (Signed)
 Requesting: Ambien  10mg   Contract:09/03/23 UDS: None Last Visit: 08/27/23 Next Visit: 03/03/24 Last Refill: 09/22/23 #28 and 0RF   Please Advise

## 2023-12-19 NOTE — Telephone Encounter (Signed)
 PDMP okay, Rx sent

## 2024-01-02 ENCOUNTER — Encounter: Payer: Self-pay | Admitting: Internal Medicine

## 2024-01-08 NOTE — Addendum Note (Signed)
 Addended by: TAWNI DRILLING D on: 01/08/2024 03:37 PM   Modules accepted: Orders

## 2024-01-08 NOTE — Progress Notes (Signed)
 [facility] Summary Report / Loop Recorder

## 2024-01-19 ENCOUNTER — Ambulatory Visit (INDEPENDENT_AMBULATORY_CARE_PROVIDER_SITE_OTHER)

## 2024-01-19 DIAGNOSIS — I480 Paroxysmal atrial fibrillation: Secondary | ICD-10-CM | POA: Diagnosis not present

## 2024-01-19 LAB — CUP PACEART REMOTE DEVICE CHECK
Date Time Interrogation Session: 20250727233849
Implantable Pulse Generator Implant Date: 20240126

## 2024-01-22 ENCOUNTER — Ambulatory Visit: Payer: Self-pay | Admitting: Cardiology

## 2024-01-26 ENCOUNTER — Encounter

## 2024-02-19 ENCOUNTER — Ambulatory Visit

## 2024-02-19 DIAGNOSIS — I480 Paroxysmal atrial fibrillation: Secondary | ICD-10-CM

## 2024-02-19 LAB — CUP PACEART REMOTE DEVICE CHECK
Date Time Interrogation Session: 20250827232949
Implantable Pulse Generator Implant Date: 20240126

## 2024-02-20 ENCOUNTER — Ambulatory Visit: Payer: Self-pay | Admitting: Cardiology

## 2024-02-25 ENCOUNTER — Ambulatory Visit

## 2024-03-01 ENCOUNTER — Encounter

## 2024-03-03 ENCOUNTER — Ambulatory Visit: Admitting: Internal Medicine

## 2024-03-03 NOTE — Progress Notes (Signed)
 [facility] Summary Report / Loop Recorder

## 2024-03-04 NOTE — Progress Notes (Signed)
 Remote Loop Recorder Transmission

## 2024-03-11 ENCOUNTER — Other Ambulatory Visit: Payer: Self-pay | Admitting: Internal Medicine

## 2024-03-19 ENCOUNTER — Other Ambulatory Visit: Payer: Self-pay | Admitting: Internal Medicine

## 2024-03-22 ENCOUNTER — Ambulatory Visit

## 2024-03-22 DIAGNOSIS — I480 Paroxysmal atrial fibrillation: Secondary | ICD-10-CM

## 2024-03-22 LAB — CUP PACEART REMOTE DEVICE CHECK
Date Time Interrogation Session: 20250928233127
Implantable Pulse Generator Implant Date: 20240126

## 2024-03-24 ENCOUNTER — Ambulatory Visit: Admitting: Internal Medicine

## 2024-03-24 NOTE — Progress Notes (Signed)
 Remote Loop Recorder Transmission

## 2024-03-25 NOTE — Progress Notes (Signed)
 Remote Loop Recorder Transmission

## 2024-03-26 ENCOUNTER — Ambulatory Visit: Payer: Self-pay | Admitting: Cardiology

## 2024-03-29 ENCOUNTER — Other Ambulatory Visit: Payer: Self-pay | Admitting: Internal Medicine

## 2024-04-05 ENCOUNTER — Encounter

## 2024-04-08 ENCOUNTER — Telehealth: Payer: Self-pay | Admitting: Internal Medicine

## 2024-04-08 NOTE — Telephone Encounter (Signed)
 Requesting: Ambien  10mg   Contract: 09/03/23 UDS: Ambien  only Last Visit: 08/27/23 Next Visit: 04/14/24 Last Refill: 12/19/23 #28 and 3RF   Please Advise

## 2024-04-08 NOTE — Telephone Encounter (Signed)
PDMP okay prescription sent 

## 2024-04-14 ENCOUNTER — Ambulatory Visit: Admitting: Internal Medicine

## 2024-04-14 ENCOUNTER — Ambulatory Visit

## 2024-04-14 ENCOUNTER — Encounter: Payer: Self-pay | Admitting: Internal Medicine

## 2024-04-14 VITALS — BP 126/72 | HR 56 | Temp 98.2°F | Resp 16 | Ht 74.0 in | Wt 224.0 lb

## 2024-04-14 DIAGNOSIS — E119 Type 2 diabetes mellitus without complications: Secondary | ICD-10-CM | POA: Diagnosis not present

## 2024-04-14 DIAGNOSIS — I10 Essential (primary) hypertension: Secondary | ICD-10-CM | POA: Diagnosis not present

## 2024-04-14 DIAGNOSIS — E785 Hyperlipidemia, unspecified: Secondary | ICD-10-CM

## 2024-04-14 DIAGNOSIS — N400 Enlarged prostate without lower urinary tract symptoms: Secondary | ICD-10-CM | POA: Diagnosis not present

## 2024-04-14 DIAGNOSIS — I480 Paroxysmal atrial fibrillation: Secondary | ICD-10-CM

## 2024-04-14 DIAGNOSIS — K130 Diseases of lips: Secondary | ICD-10-CM

## 2024-04-14 DIAGNOSIS — Z23 Encounter for immunization: Secondary | ICD-10-CM

## 2024-04-14 LAB — CBC WITH DIFFERENTIAL/PLATELET
Basophils Absolute: 0 K/uL (ref 0.0–0.1)
Basophils Relative: 0.5 % (ref 0.0–3.0)
Eosinophils Absolute: 0.2 K/uL (ref 0.0–0.7)
Eosinophils Relative: 2.9 % (ref 0.0–5.0)
HCT: 41.7 % (ref 39.0–52.0)
Hemoglobin: 13.6 g/dL (ref 13.0–17.0)
Lymphocytes Relative: 23.1 % (ref 12.0–46.0)
Lymphs Abs: 1.6 K/uL (ref 0.7–4.0)
MCHC: 32.6 g/dL (ref 30.0–36.0)
MCV: 97.3 fl (ref 78.0–100.0)
Monocytes Absolute: 0.6 K/uL (ref 0.1–1.0)
Monocytes Relative: 8.5 % (ref 3.0–12.0)
Neutro Abs: 4.6 K/uL (ref 1.4–7.7)
Neutrophils Relative %: 65 % (ref 43.0–77.0)
Platelets: 131 K/uL — ABNORMAL LOW (ref 150.0–400.0)
RBC: 4.29 Mil/uL (ref 4.22–5.81)
RDW: 13.5 % (ref 11.5–15.5)
WBC: 7.1 K/uL (ref 4.0–10.5)

## 2024-04-14 LAB — BASIC METABOLIC PANEL WITH GFR
BUN: 17 mg/dL (ref 6–23)
CO2: 31 meq/L (ref 19–32)
Calcium: 9.4 mg/dL (ref 8.4–10.5)
Chloride: 99 meq/L (ref 96–112)
Creatinine, Ser: 0.87 mg/dL (ref 0.40–1.50)
GFR: 88.89 mL/min (ref >60.00)
Glucose, Bld: 109 mg/dL — ABNORMAL HIGH (ref 70–99)
Potassium: 3.7 meq/L (ref 3.5–5.1)
Sodium: 139 meq/L (ref 135–145)

## 2024-04-14 LAB — PSA: PSA: 0.52 ng/mL (ref 0.10–4.00)

## 2024-04-14 LAB — ALT: ALT: 18 U/L (ref 0–53)

## 2024-04-14 LAB — HEMOGLOBIN A1C: Hgb A1c MFr Bld: 5.8 % (ref 4.6–6.5)

## 2024-04-14 LAB — LIPID PANEL
Cholesterol: 148 mg/dL (ref 0–200)
HDL: 53.5 mg/dL (ref >39.00)
LDL Cholesterol: 76 mg/dL (ref 0–99)
NonHDL: 94.32
Total CHOL/HDL Ratio: 3
Triglycerides: 94 mg/dL (ref 0.0–149.0)
VLDL: 18.8 mg/dL (ref 0.0–40.0)

## 2024-04-14 LAB — AST: AST: 20 U/L (ref 0–37)

## 2024-04-14 NOTE — Progress Notes (Signed)
 Subjective:    Patient ID: Cameron Carey, male    DOB: 1956-06-19, 68 y.o.   MRN: [id number]  DOS:  04/14/2024 Follow-up Discussed the use of AI scribe software for clinical note transcription with the patient, who gave verbal consent to proceed.  History of Present Illness Cameron Carey is a 68 year old male who presents for a routine follow-up visit.  Cardiovascular - No chest pain, dyspnea, lower extremity edema, or palpitations - Blood pressure well-controlled at home - Takes metoprolol  XL 150 mg daily, amlodipine  5 mg daily, and hydrochlorothiazide  12.5 mg daily - Last cardiology evaluation in January 2024 - Receives weekly heart device monitoring reports  Gout management - Gout well-controlled with current medication regimen  Cutaneous lesion - Persistent lesion on lip for several months, nonhealing.  Colorectal cancer screening - Last colonoscopy performed in 2020 in [place]  - Recommended follow-up colonoscopy in five years; currently due for repeat screening  General health and functional status - Feeling well and remains active through employment at [employer] - In process of relocating to [place]  and plans to establish care with a new physician     Review of Systems See above   Past Medical History:  Diagnosis Date   Abnormal stress echo    Allergy    Anxiety    Aortic valve stenosis    Arthropathy    Atrial fibrillation (HCC)    Brachial radiculitis    Edema    Enthesopathy of ankle    Esophageal dysmotility    Gout    Hammertoe of second toe of right foot    corrected   History of heart valve replacement    HTN (hypertension)    Hx of transient ischemic attack (TIA) 12/2012   Hypercholesterolemia    Insomnia    Left ventricular hypertrophy    Obesity (BMI 30-39.9)    Orthostatic hypotension    Syncope and collapse     Past Surgical History:  Procedure Laterality Date   AORTIC VALVE REPLACEMENT  2016   ATRIAL FIBRILLATION  ABLATION N/A 02/02/2021   Procedure: ATRIAL FIBRILLATION ABLATION;  Surgeon: Cindie Ole DASEN, MD;  Location: [facility];  Service: Cardiovascular;  Laterality: N/A;   CARDIOVERSION  06/20/2015   CARDIOVERSION N/A 10/03/2020   Procedure: CARDIOVERSION;  Surgeon: Delford Maude BROCKS, MD;  Location: [facility]OS[facility];  Service: Cardiovascular;  Laterality: N/A;   HAMMER TOE SURGERY Right 2015   MEDIAL PARTIAL KNEE REPLACEMENT Bilateral 2020   2 procedures done 1 month apart    METATARSAL OSTEOTOMY Right 2015   R 2nd metatarsal   NASAL SEPTUM SURGERY  1996   RIGHT AND LEFT HEART CATH  02/13/2015   TEE WITHOUT CARDIOVERSION N/A 02/01/2021   Procedure: TRANSESOPHAGEAL ECHOCARDIOGRAM (TEE);  Surgeon: Delford Maude BROCKS, MD;  Location: [facility] ENDOSCOPY;  Service: Cardiovascular;  Laterality: N/A;   TONSILLECTOMY     UVULECTOMY      Current Outpatient Medications  Medication Instructions   allopurinol  (ZYLOPRIM ) 200 mg, Oral, Daily   amLODipine  (NORVASC ) 5 mg, Oral, Daily   aspirin  EC 81 mg, Oral, Daily, Swallow whole.   atorvastatin  (LIPITOR) 40 mg, Oral, Daily at bedtime   fluocinolone  0.01 % cream 1 application , Topical, Daily PRN   hydrochlorothiazide  (HYDRODIURIL ) 12.5 mg, Oral, Daily   hydrOXYzine  (ATARAX ) 25 mg, Oral, At bedtime PRN   metoprolol  succinate (TOPROL -XL) 150 mg, Oral, Daily, Take with or immediately following a meal   Multiple Vitamin (MULTIVITAMIN WITH MINERALS) TABS  tablet 1 tablet, Daily   olmesartan  (BENICAR ) 40 mg, Oral, Daily   tadalafil  (CIALIS ) 20 MG tablet TAKE 1 TABLET BY MOUTH DAILY AS NEEDED FOR ERECTILE DYSFUNCTION   zolpidem  (AMBIEN ) 5-10 mg, Oral, At bedtime PRN, for sleep       Objective:   Physical Exam HENT:     Head:     BP 126/72   Pulse (!) 56   Temp 98.2 F (36.8 C) (Oral)   Resp 16   Ht 6' 2 (1.88 m)   Wt 224 lb (101.6 kg)   SpO2 97%   BMI 28.76 kg/m  General:   Well developed, NAD, BMI noted. HEENT:  Normocephalic . Face symmetric,  atraumatic Lungs:  CTA B Normal respiratory effort, no intercostal retractions, no accessory muscle use. Heart: RRR, question of murmur.  DM foot exam: No edema, pinprick examination normal Skin: Not pale. Not jaundice Neurologic:  alert & oriented X3.  Speech normal, gait appropriate for age and unassisted Psych--  Cognition and judgment appear intact.  Cooperative with normal attention span and concentration.  Behavior appropriate. No anxious or depressed appearing.      Assessment     Assessment (new patient 08/2020, referred by Dr. Delford) DM: A1c 6.5 (June 2022) HTN High cholesterol Insomnia  CV: --AVR, bioprosthetic, 2018 in [place] . --Postop A. Fib >>> on  A. Fib again ~ 05-2020  --No CAD --Ablation 01-2021 Gout  OSA : better after uvulectomy  (~ 2000s).  sleep study 05/2021, moderate was Rx a CPAP Dry skin: rarely use topical steroids     Assessment & Plan Suspected actinic keratosis   left lower lip Lesion on left lower lip going on for few months, not healing.   Plan: Refer to dermatology  DM: No neuropathy or foot exam.  Diet controlled, blood sugar levels not monitored at home.  Check A1c HTN Blood pressure well-controlled with current medication. No symptoms of cardiovascular issues. Check a BMP and CBC Hypercholesterolemia Continues on atorvastatin  40 mg daily.  Check FLP, AST ALT Gout  Gout well-controlled with allopurinol .  No change History of AVR 2018, history of A-fib status post ablation:  No symptoms, recommend to see cardiology for routine checkup before he moves to [place] .  Question of murmur on exam today. General Health Maintenance Moving to [place]  06/2024, discussed transferring medical records proactively work on finding a new primary doctor there. He is due for a colonoscopy  November 2025 and recommend to arrange that in his new location. Check PSA for prostate cancer screening. Flu shot today, recommend a COVID  booster RTC 6 months if he is still in this area.

## 2024-04-14 NOTE — Assessment & Plan Note (Signed)
 Suspected actinic keratosis   left lower lip Lesion on left lower lip going on for few months, not healing.   Plan: Refer to dermatology  DM: No neuropathy or foot exam.  Diet controlled, blood sugar levels not monitored at home.  Check A1c HTN Blood pressure well-controlled with current medication. No symptoms of cardiovascular issues. Check a BMP and CBC Hypercholesterolemia Continues on atorvastatin  40 mg daily.  Check FLP, AST ALT Gout  Gout well-controlled with allopurinol .  No change History of AVR 2018, history of A-fib status post ablation:  No symptoms, recommend to see cardiology for routine checkup before he moves to [place] .  Question of murmur on exam today. General Health Maintenance Moving to [place]  06/2024, discussed transferring medical records proactively work on finding a new primary doctor there. He is due for a colonoscopy  November 2025 and recommend to arrange that in his new location. Check PSA for prostate cancer screening. Flu shot today, recommend a COVID booster RTC 6 months if he is still in this area.

## 2024-04-14 NOTE — Patient Instructions (Signed)
 GO TO THE LAB :  Get the blood work    Then, go to the front desk for the checkout Please make an appointment for a follow-up in 6 months if you are still in [facility]   Continue checking your blood pressure regularly Blood pressure goal:  between 110/65 and  135/85. If it is consistently higher or lower, let me know  Referrals: To cardiology To dermatology  Please remember to see a gastroenterologist when you arrive to [place] , you will be due for another colonoscopy in November    Please read more detailed instructions below  VISIT SUMMARY: You had a routine follow-up visit where we discussed your cardiovascular health, gout management, a persistent lip lesion, and general health maintenance. Your blood pressure and gout are well-controlled, and we addressed the need for further evaluation of your lip lesion and upcoming screenings.  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 -You are referred to dermatology for further evaluation.  TYPE 2 DIABETES MELLITUS WITHOUT COMPLICATIONS: Your diabetes is currently without complications, and you do not monitor your blood sugar levels at home. -A Basic Metabolic Panel (BMP) has been ordered to check your blood sugar levels.  HYPERTENSION: Your blood pressure is well-controlled with your current medication. -Continue taking metoprolol  XL 150 mg daily, amlodipine  5 mg daily, and hydrochlorothiazide  12.5 mg daily.  HYPERCHOLESTEROLEMIA: Your cholesterol levels are being managed with medication. -Continue taking atorvastatin  40 mg daily.  GOUT, CONTROLLED: Your gout is well-controlled with your current medication, and you have not had an attack in 30 years. -Continue taking allopurinol   .  GENERAL HEALTH MAINTENANCE: We discussed transferring your medical records, establishing care in [place] , and your upcoming  colonoscopy. -Blood work has been ordered, including liver function tests, cholesterol levels, blood count, and PSA. -You are referred to cardiology for a follow-up before moving. -Contact a gastroenterologist in [place]  to schedule your colonoscopy, which is due in November 2025. -We discussed the importance of getting a COVID vaccination.

## 2024-04-15 ENCOUNTER — Encounter: Payer: Self-pay | Admitting: Dermatology

## 2024-04-15 ENCOUNTER — Ambulatory Visit (INDEPENDENT_AMBULATORY_CARE_PROVIDER_SITE_OTHER): Admitting: Dermatology

## 2024-04-15 VITALS — BP 175/85 | HR 54

## 2024-04-15 DIAGNOSIS — L570 Actinic keratosis: Secondary | ICD-10-CM

## 2024-04-15 DIAGNOSIS — W908XXA Exposure to other nonionizing radiation, initial encounter: Secondary | ICD-10-CM | POA: Diagnosis not present

## 2024-04-15 DIAGNOSIS — L578 Other skin changes due to chronic exposure to nonionizing radiation: Secondary | ICD-10-CM

## 2024-04-15 NOTE — Progress Notes (Addendum)
   New Patient Visit   Subjective  Cameron Carey is a 68 y.o. male who presents for the following: lesion on lip  Patient states he  has lesion located at the lip that he  would like to have examined.  Patient reports the areas have been there for 4-5 months. He reports the areas are not bothersome. He states that the areas have not spread or became larger Patient reports he has not previously been treated for these areas.  Patient reports that he picks at it or bites at it and it will go away and then come back. Patient report he has not used any ointments or creams on the area Patient denies Hx of bx. Patient repots family history of skin cancer(s), Father does not recall diagnosis  The patient has spots, moles and lesions to be evaluated, some may be new or changing and the patient may have concern these could be cancer.   The following portions of the chart were reviewed this encounter and updated as appropriate: medications, allergies, medical history  Review of Systems:  No other skin or systemic complaints except as noted in HPI or Assessment and Plan.  Objective  Well appearing patient in no apparent distress; mood and affect are within normal limits.   focused examination was performed of the following areas: Lip  Relevant exam findings are noted in the Assessment and Plan.    Left Lower Vermilion Lip Pink pearly papule  Assessment & Plan  ACTINIC DAMAGE - chronic, secondary to cumulative UV radiation exposure/sun exposure over time - diffuse scaly erythematous macules with underlying dyspigmentation - Recommend daily broad spectrum sunscreen SPF 30+ to sun-exposed areas, reapply every 2 hours as needed.  - Recommend staying in the shade or wearing long sleeves, sun glasses (UVA+UVB protection) and wide brim hats (4-inch brim around the entire circumference of the hat). - Call for new or changing lesions. -Return in 2 months for FBSE  Assessment & Plan   ACTINIC  KERATOSIS Left Lower Vermilion Lip Destruction of lesion - Left Lower Vermilion Lip Complexity: simple   Destruction method: cryotherapy   Informed consent: discussed and consent obtained   Timeout:  patient name, date of birth, surgical site, and procedure verified Lesion destroyed using liquid nitrogen: Yes   Post-procedure details: wound care instructions given       Return if symptoms worsen or fail to improve.  I, Lyle Cords, as acting as a neurosurgeon for [employer], DO .    Documentation: I have reviewed the above documentation for accuracy and completeness, and I agree with the above.  Delon Lenis, DO

## 2024-04-15 NOTE — Patient Instructions (Addendum)

## 2024-04-16 ENCOUNTER — Ambulatory Visit: Payer: Self-pay | Admitting: Internal Medicine

## 2024-04-16 ENCOUNTER — Other Ambulatory Visit: Payer: Self-pay | Admitting: Internal Medicine

## 2024-04-22 ENCOUNTER — Ambulatory Visit

## 2024-04-22 ENCOUNTER — Ambulatory Visit: Payer: Self-pay | Admitting: Cardiology

## 2024-04-22 DIAGNOSIS — I480 Paroxysmal atrial fibrillation: Secondary | ICD-10-CM | POA: Diagnosis not present

## 2024-04-22 LAB — CUP PACEART REMOTE DEVICE CHECK
Date Time Interrogation Session: 20251029232924
Implantable Pulse Generator Implant Date: 20240126

## 2024-04-27 NOTE — Progress Notes (Signed)
 Remote Loop Recorder Transmission

## 2024-05-03 NOTE — Addendum Note (Signed)
 Addended by: ALM DELON SAILOR on: 05/03/2024 08:54 PM   Modules accepted: Orders

## 2024-05-10 ENCOUNTER — Encounter

## 2024-05-18 ENCOUNTER — Telehealth: Payer: Self-pay | Admitting: Cardiovascular Disease

## 2024-05-18 NOTE — Telephone Encounter (Signed)
   Name: MAURIZIO GENO  DOB: [birth year]  MRN: [id number]  Primary Cardiologist: Maude Emmer, MD  Chart reviewed as part of pre-operative protocol coverage. Because of Danile Trier Gayden's past medical history and time since last visit, he will require a follow-up in-office visit in order to better assess preoperative cardiovascular risk.  Patient has not been seen since 06/2022.  Pre-op covering staff: - Please schedule appointment and call patient to inform them. If patient already had an upcoming appointment within acceptable timeframe, please add pre-op clearance to the appointment notes so provider is aware. - Please contact requesting surgeon's office via preferred method (i.e, phone, fax) to inform them of need for appointment prior to surgery.   Damien JAYSON Braver, NP  05/18/2024, 1:56 PM

## 2024-05-18 NOTE — Telephone Encounter (Signed)
 Our office just received this request today for 05/24/24, with Thanksgiving holiday the office will be closed 11/27 and 05/21/24, procedure on Monday 05/24/24. I asked pt if DR. Zaldivar office advised when he should hold his ASA, he tells me that they said as of today.    Pt is not sure why the request was faxed to our office at the last minute. I assured the pt that we will do all that we can to not delay his procedure. Pt is willing to go to [facility] office for preop clearance. Pt asked if I could text him the address for [facility] office. I said I will send to [facility] CHART for him. Pt thanked me for all of my help.    I will send a note as FYI to Dr. Eulalio team.

## 2024-05-18 NOTE — Telephone Encounter (Signed)
   Pre-operative Risk Assessment    Patient Name: Cameron Carey  DOB: May 01, 1956 MRN: [id number]   Date of last office visit: 07/19/22 Date of next office visit: TBD    Request for Surgical Clearance    Procedure:  bilateral direct brow lift   Date of Surgery:  Clearance 05/24/24                                Surgeon:  Renzo Zaldivar  Surgeon's Group or Practice Name:  [facility]  Phone number:  [phone]  Fax number:  [phone]   Type of Clearance Requested:   - Medical  - Pharmacy:  Hold Aspirin  defer to cardiology    Type of Anesthesia:  MAC   Additional requests/questions:    SignedLarraine Salt   05/18/2024, 1:47 PM    Office states they will need clearance by 11/26 at 3pm to retain pts procedure appt.

## 2024-05-19 ENCOUNTER — Encounter: Payer: Self-pay | Admitting: Cardiology

## 2024-05-19 ENCOUNTER — Ambulatory Visit: Attending: Cardiology | Admitting: Cardiology

## 2024-05-19 VITALS — BP 140/72 | HR 47 | Ht 75.0 in | Wt 220.2 lb

## 2024-05-19 DIAGNOSIS — R011 Cardiac murmur, unspecified: Secondary | ICD-10-CM | POA: Diagnosis present

## 2024-05-19 DIAGNOSIS — I10 Essential (primary) hypertension: Secondary | ICD-10-CM | POA: Insufficient documentation

## 2024-05-19 DIAGNOSIS — G4733 Obstructive sleep apnea (adult) (pediatric): Secondary | ICD-10-CM | POA: Diagnosis present

## 2024-05-19 DIAGNOSIS — R001 Bradycardia, unspecified: Secondary | ICD-10-CM | POA: Diagnosis present

## 2024-05-19 DIAGNOSIS — Z01810 Encounter for preprocedural cardiovascular examination: Secondary | ICD-10-CM | POA: Insufficient documentation

## 2024-05-19 DIAGNOSIS — E782 Mixed hyperlipidemia: Secondary | ICD-10-CM | POA: Insufficient documentation

## 2024-05-19 DIAGNOSIS — I4819 Other persistent atrial fibrillation: Secondary | ICD-10-CM | POA: Insufficient documentation

## 2024-05-19 DIAGNOSIS — Z952 Presence of prosthetic heart valve: Secondary | ICD-10-CM | POA: Insufficient documentation

## 2024-05-19 NOTE — Progress Notes (Signed)
 Cardiology Office Note   Date:  05/19/2024  ID:  Cameron Carey, DOB [birth year], MRN [id number] PCP: Amon Aloysius BRAVO, MD  [facility] Providers Cardiologist:  Maude Emmer, MD Electrophysiologist:  OLE ONEIDA HOLTS, MD     History of Present Illness Cameron Carey is a 68 y.o. male with past medical history of CAD, hyperlipidemia, hypertension, bioprosthetic AVR (06/16/2015), atrial fibrillation status post ablation (01/2021), status post ILR implantation (07/19/2022), who is here today for follow-up and requires a preprocedure cardiovascular examination.   History of bioprosthetic AVR (25 mm Sorin Crown) done in [place] 06/16/2015.  At he had new onset atrial fibrillation status post surgery that required cardioversion x 3.  He had no coronary artery disease prior to surgery by cath.  He will follow-up with his cardiologist in [place] in December 2021 prior to moving to the area and was found to be in A-fib.  He was advised to follow-up as he was moving at that time.  He established with Dr. Admission and was scheduled for cardioversion after being started on Xarelto  07/25/2020 for CHA2DS2-VASc score of at least 3.  He had successful cardioversion but had had a reentry atrial fibrillation.  He followed with A-fib clinic 10/17/2020 wanted to discuss means of restoring sinus rhythm.  He was not symptomatic in atrial fibrillation.  He was referred to EP to discuss options about ablation.  As there was several medication concerns with flecainide due to first-degree AV block and Tikosyn due to his QTc at baseline being 4 and 76 ms, there was also concerns about amiodarone due to his age as he was younger.  He followed up with Dr. Holts 11/10/2020 without chest pains or shortness of breath.  He had controlled ventricular rates but was symptomatic with decreased exercise tolerance.  With PR QTc precluded safe use of class [place] class III antiarrhythmic drugs.  And he was too young for that from use of  amiodarone.  Given the persistent nature of his atrial fibrillation he was scheduled for EP study and radiofrequency ablation for A-fib.  TEE was completed prior to his atrial fibrillation ablation on 02/01/2021.  He underwent ablation procedure 02/02/2021 without any immediate postoperative complications.  He was 1 month follow-up status post ablation was feeling great when he was seen in clinic on 03/06/2021.  No A-fib to report.  There was no groin swelling or difficulties.  He was back to his usual routine.  Continues to do well maintaining sinus rhythm.  He was recommended not to interrupt his Xarelto  for full 3 months recovery.  Status post ablation.  He was last seen in clinic 07/19/2022 and followed up with Dr. Holts from EP.  He expressed a strong interest in stopping his Xarelto  and was interested in pursuing a loop recorder for atrial fibrillation surveillance with mechanism to avoid long-term exposure to anticoagulation.  When he was seen in clinic he was doing well.  He had his loop recorder implant and it was advised to stop his anticoagulation but to take aspirin  81 mg daily.  He was encouraged to continue to do remote uploads of his ILR and to follow-up with EP on a yearly basis.   He returns to clinic today stating he has been doing well for the cardiac perspective.  He denies any chest pain, shortness of breath, dyspnea on exertion, peripheral edema.  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  With his previous blood pressure regimen he had become dizzy.  The symptoms have since resolved.  He states that he continues to drive for [employer] and enjoys it.  Remains active.  States that he has been compliant with his current medication regimen without any undue side effects.  Has not had any reoccurrence of atrial fibrillation.  Continues to do remote uploads of his ILR.  Is undergoing a  surgical procedure on Monday at [facility].  Denies any hospitalizations or visits to the emergency department.  States that he will be moving back to [place] .  He has been encouraged to maintain follow-up with his cardiologist in [place] .  ROS: 10 point review of systems has been reviewed and considered negative the exception was been listed in the HPI  Studies Reviewed EKG Interpretation Date/Time:  Wednesday May 19 2024 10:31:17 EST Ventricular Rate:  47 PR Interval:  188 QRS Duration:  90 QT Interval:  488 QTC Calculation: 431 R Axis:   54  Text Interpretation: Sinus bradycardia Minimal voltage criteria for LVH, may be normal variant ( Sokolow-Lyon ) When compared with ECG of 06-Mar-2021 09:16, No significant change was found Confirmed by Gerard Frederick ([id number]) on 05/19/2024 10:32:51 AM    EPS/ Afib ablation (02/02/2021) CONCLUSIONS: 1. Successful PVI 2. Successful ablation/isolation of the posterior wall 3. Intracardiac echo reveals trivial pericardial effusion, normal LV function. 4. No early apparent complications.   TEE 02/01/2021  1. Left ventricular ejection fraction, by estimation, is 55 to 60%. The  left ventricle has normal function. Left ventricular diastolic function  could not be evaluated.   2. Right ventricular systolic function is normal. The right ventricular  size is normal.   3. Left atrial size was moderately dilated. No left atrial/left atrial  appendage thrombus was detected.   4. Right atrial size was moderately dilated.   5. The mitral valve is normal in structure. Mild mitral valve  regurgitation.   6. Tricuspid valve regurgitation is mild to moderate.   7. Post AVR with stented bioprothetic valve No PVL mean gradient 8 mmHg  AVA 2.2 cm2. The aortic valve has been repaired/replaced. Aortic valve  regurgitation is not visualized. There is a valve present in the aortic  position. Procedure Date: 06/16/2015.   2d echo 08/17/2020 1.  Left ventricular ejection fraction, by estimation, is 55 to 60%. The  left ventricle has normal function. The left ventricle has no regional  wall motion abnormalities. Left ventricular diastolic parameters are  indeterminate.   2. Right ventricular systolic function is mildly reduced. The right  ventricular size is moderately enlarged. There is normal pulmonary artery  systolic pressure. The estimated right ventricular systolic pressure is  33.0 mmHg. D-shaped interventricular  septum suggestive of RV pressure/volume overload.   3. Right atrial size was mildly dilated.   4. The mitral valve is normal in structure. Trivial mitral valve  regurgitation. No evidence of mitral stenosis. Moderate mitral annular  calcification.   5. There is a bioprosthetic aortic valve. Mean gradient mildly elevated,  19 mmHg. No significant regurgitation.   6. The inferior vena cava is dilated in size with <50% respiratory  variability, suggesting right atrial pressure of 15 mmHg.   7. The patient was in atrial fibrillation.  Risk Assessment/Calculations  HYPERTENSION CONTROL Vitals:   05/19/24 1027 05/19/24 1042  BP: (!) 150/60 (!) 140/72    The patient's blood pressure is elevated above target today.  In order to  address the patient's elevated BP: Blood pressure will be monitored at home to determine if medication changes need to be made.          Physical Exam VS:  BP (!) 140/72 (BP Location: Left Arm, Patient Position: Sitting, Cuff Size: Normal)   Pulse (!) 47   Ht 6' 3 (1.905 m)   Wt 220 lb 4 oz (99.9 kg)   SpO2 96%   BMI 27.53 kg/m        Wt Readings from Last 3 Encounters:  05/19/24 220 lb 4 oz (99.9 kg)  04/14/24 224 lb (101.6 kg)  08/27/23 228 lb 8 oz (103.6 kg)    GEN: Well nourished, well developed in no acute distress NECK: No JVD; No carotid bruits CARDIAC: RRR, I/VI systolic murmur RUSB, without rubs or gallops RESPIRATORY:  Clear to auscultation without rales, wheezing  or rhonchi  ABDOMEN: Soft, non-tender, non-distended EXTREMITIES:  No edema; No deformity   ASSESSMENT AND PLAN Persistent atrial fibrillation status post cardioversion and then ablation.  He has had no reoccurrence of atrial fibrillation since his procedure.  ILR continues to to be monitored by remote uploads with no reoccurrence of atrial fibrillation noted.  EKG today reveals sinus bradycardia with rate of 47 with LVH with no acute ischemic changes noted.  He is continued on aspirin  81 mg daily and Toprol -XL150 mg daily.  He has already been advised by his surgical office to hold his aspirin .  Primary hypertension with a blood pressure today 150/60 and repeat 140/72.  Further chart review reveals blood pressures outside of the 170s.  He has noted that previously his blood pressure had been stable but he was dizzy after losing weight and discussed with his primary care provider about decreasing some of his medications.  He is continued on amlodipine  5 mg daily, HCTZ 12.5 mg daily, Toprol -XL, and olmesartan  40 mg daily.  He has been encouraged to continue to monitor his pressures 1 to 2 hours postmedication administration as well.  Bioprosthetic AVR (2016) due to severe aortic stenosis with his last echocardiogram completed 08/13/2020 with a mean gradient of 8 mmHg and AVA 2.2 centimeters squared. Heart murmur noted on exam today. He has been scheduled for an updated study just for reevaluation.  Hyperlipidemia with his last LDL of 76.  He has been continued on atorvastatin  40 mg daily.  Ongoing management per his PCP.  Sinus bradycardia noted on EKG where he is chronotropically appropriate as his heart rate is elevated with activity.  He is continued on his current medications today as he continues to be asymptomatic.  Continue to monitor with surveillance studies.  Obstructive sleep apnea.  His sleep study that was positive in 2022.  He was recommended that he use a CPAP but patient states he does  not use CPAP.  He has been reluctant to use it as he states that he does not snore and has good energy and is not sleepy.     Mr. Sherwin perioperative risk of a major cardiac event is 0.4% according to the Revised Cardiac Risk Index (RCRI).  Therefore, he is at low risk for perioperative complications.   His functional capacity is excellent at 8.97 METs according to the Duke Activity Status Index (DASI).  Antiplatelet and/or Anticoagulation Recommendations: Aspirin  can be held for 5 days prior to his surgery.  Please resume Aspirin  post operatively when it is felt to be safe from a bleeding standpoint.     Dispo: Patient return to clinic  to see MD/APP in clinic in 11-12 months or sooner if needed for further evaluation.  His surgery is a low risk surgery and he shows no signs of cardiac decompensation.  He can proceed with acceptable risk without any further testing.  Echocardiogram has been ordered just to follow-up on previous AVR with his last study being completed in 2022.  Signed, Eustacia Urbanek, NP

## 2024-05-19 NOTE — Progress Notes (Signed)
 Efaxed note to Dr. Renzo Zaldivar at [phone]

## 2024-05-19 NOTE — Patient Instructions (Addendum)
 Medication Instructions:  Your physician recommends that you continue on your current medications as directed. Please refer to the Current Medication list given to you today.   *If you need a refill on your cardiac medications before your next appointment, please call your pharmacy*  Lab Work: No labs ordered today  If you have labs (blood work) drawn today and your tests are completely normal, you will receive your results only by: MyChart Message (if you have MyChart) OR A paper copy in the mail If you have any lab test that is abnormal or we need to change your treatment, we will call you to review the results.  Testing/Procedures: Your physician has requested that you have an echocardiogram. Echocardiography is a painless test that uses sound waves to create images of your heart. It provides your doctor with information about the size and shape of your heart and how well your heart's chambers and valves are working.   You may receive an ultrasound enhancing agent through an IV if needed to better visualize your heart during the echo. This procedure takes approximately one hour.  There are no restrictions for this procedure.  This will take place at [facility].  Please note: We ask at that you not bring children with you during ultrasound (echo/ vascular) testing. Due to room size and safety concerns, children are not allowed in the ultrasound rooms during exams. Our front office staff cannot provide observation of children in our lobby area while testing is being conducted. An adult accompanying a patient to their appointment will only be allowed in the ultrasound room at the discretion of the ultrasound technician under special circumstances. We apologize for any inconvenience.   Follow-Up: At [facility], you and your health needs are our priority.  As part of our continuing mission to provide you with exceptional heart care, our providers are all part of one team.  This team  includes your primary Cardiologist (physician) and Advanced Practice Providers or APPs (Physician Assistants and Nurse Practitioners) who all work together to provide you with the care you need, when you need it.  Your next appointment:   12 month(s)  Provider:   You may see Maude Emmer, MD or one of the following Advanced Practice Providers on your designated Care Team:   Tylene Lunch, NP

## 2024-05-23 ENCOUNTER — Ambulatory Visit

## 2024-05-24 ENCOUNTER — Encounter

## 2024-05-25 ENCOUNTER — Ambulatory Visit

## 2024-05-25 DIAGNOSIS — I4819 Other persistent atrial fibrillation: Secondary | ICD-10-CM | POA: Diagnosis not present

## 2024-05-26 LAB — CUP PACEART REMOTE DEVICE CHECK
Date Time Interrogation Session: 20251201232327
Implantable Pulse Generator Implant Date: 20240126

## 2024-05-28 NOTE — Progress Notes (Signed)
 Remote Loop Recorder Transmission

## 2024-06-03 ENCOUNTER — Other Ambulatory Visit: Payer: Self-pay | Admitting: Internal Medicine

## 2024-06-10 ENCOUNTER — Ambulatory Visit: Payer: Self-pay | Admitting: Cardiology

## 2024-06-14 ENCOUNTER — Encounter

## 2024-06-23 ENCOUNTER — Ambulatory Visit

## 2024-06-24 ENCOUNTER — Other Ambulatory Visit: Payer: Self-pay | Admitting: Internal Medicine

## 2024-06-25 ENCOUNTER — Ambulatory Visit: Attending: Cardiology

## 2024-06-25 DIAGNOSIS — I4819 Other persistent atrial fibrillation: Secondary | ICD-10-CM

## 2024-06-26 ENCOUNTER — Ambulatory Visit: Payer: Self-pay | Admitting: Cardiology

## 2024-06-26 LAB — CUP PACEART REMOTE DEVICE CHECK
Date Time Interrogation Session: 20260101231613
Implantable Pulse Generator Implant Date: 20240126

## 2024-06-29 ENCOUNTER — Ambulatory Visit (HOSPITAL_COMMUNITY)

## 2024-07-01 NOTE — Progress Notes (Signed)
 Remote Loop Recorder Transmission

## 2024-07-21 ENCOUNTER — Ambulatory Visit (HOSPITAL_COMMUNITY)
Admission: RE | Admit: 2024-07-21 | Discharge: 2024-07-21 | Disposition: A | Discharge status: Home / Self Care | Source: Ambulatory Visit | Attending: Cardiovascular Disease | Admitting: Cardiovascular Disease

## 2024-07-21 DIAGNOSIS — Z952 Presence of prosthetic heart valve: Secondary | ICD-10-CM | POA: Diagnosis present

## 2024-07-21 LAB — ECHOCARDIOGRAM COMPLETE
AR max vel: 1.17 cm2
AV Area VTI: 1.26 cm2
AV Area mean vel: 1.2 cm2
AV Mean grad: 22.3 mmHg
AV Peak grad: 40.8 mmHg
Ao pk vel: 3.19 m/s
Area-P 1/2: 3.12 cm2
P 1/2 time: 376 ms
S' Lateral: 3.1 cm

## 2024-07-23 ENCOUNTER — Ambulatory Visit: Payer: Self-pay | Admitting: Cardiology

## 2024-07-23 DIAGNOSIS — Z952 Presence of prosthetic heart valve: Secondary | ICD-10-CM

## 2024-07-24 ENCOUNTER — Ambulatory Visit

## 2024-07-26 ENCOUNTER — Ambulatory Visit

## 2024-07-27 LAB — CUP PACEART REMOTE DEVICE CHECK
Date Time Interrogation Session: 20260201233426
Implantable Pulse Generator Implant Date: 20240126

## 2024-07-29 ENCOUNTER — Encounter: Payer: Self-pay | Admitting: Cardiovascular Disease

## 2024-07-29 ENCOUNTER — Ambulatory Visit: Admitting: Cardiovascular Disease

## 2024-07-29 VITALS — BP 153/62 | HR 54 | Ht 75.0 in | Wt 231.0 lb

## 2024-07-29 DIAGNOSIS — T8209XD Other mechanical complication of heart valve prosthesis, subsequent encounter: Secondary | ICD-10-CM | POA: Diagnosis not present

## 2024-07-29 DIAGNOSIS — Z952 Presence of prosthetic heart valve: Secondary | ICD-10-CM

## 2024-07-29 DIAGNOSIS — T8209XA Other mechanical complication of heart valve prosthesis, initial encounter: Secondary | ICD-10-CM

## 2024-07-29 DIAGNOSIS — I350 Nonrheumatic aortic (valve) stenosis: Secondary | ICD-10-CM

## 2024-07-29 NOTE — Progress Notes (Signed)
 " Cardiology Office Note:    Date:  07/29/2024   ID:  Cameron Carey, DOB [birth year], MRN [id number]  PCP:  Amon Aloysius BRAVO, MD   [facility] Providers Cardiologist:  Maude Emmer, MD Electrophysiologist:  OLE ONEIDA HOLTS, MD (Inactive)     Referring MD: Amon Aloysius BRAVO, MD   Chief Complaint  Patient presents with   Prosthetic Valve Dysfunction    History of Present Illness:    Cameron Carey is a 69 y.o. 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  The patient is here with his wife today.  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  He was treated for severe symptomatic aortic stenosis and reportedly had a bicuspid aortic valve.  At the time, he was severely symptomatic with exertional dyspnea, chest discomfort, and even exertional syncope.  He has done well since surgery.  He underwent an echocardiogram in August 2022 showing normal LVEF of 55 to 60%, normal function of his aortic valve bioprosthesis with no valvular regurgitation and a mean gradient of 8 mmHg.  He was recently seen for follow-up evaluation and was noted to have a more prominent heart murmur.  An echocardiogram was ordered and demonstrated normal LV function with LVEF 60 to 65%, normal RV function, no mitral valve disease, but new findings of moderate to severe central aortic regurgitation and increased transvalvular gradients with a mean gradient of 22 mmHg and a peak gradient of 41 mmHg.  The patient is referred now for discussion of treatment options related to his prosthetic valve dysfunction.  He works as a hospital doctor for [employer].  He is very active and has had a few episodes of very mild dyspnea with high-level physical exertion.  He otherwise feels quite well and has no symptoms of chest pain, chest pressure, or lightheadedness.  He has no dyspnea with brisk walking or routine  exercise.  He plans to move back to [place], [place] in March of this year.  His house is currently on the market.   Current Medications: Active Medications[1]   Allergies:   Bactrim [sulfamethoxazole-trimethoprim]   ROS:   Please see the history of present illness.    All other systems reviewed and are negative.  EKGs/Labs/Other Studies Reviewed:    The following studies were reviewed today: Cardiac Studies & Procedures   ______________________________________________________________________________________________     ECHOCARDIOGRAM  ECHOCARDIOGRAM COMPLETE 07/21/2024  Narrative ECHOCARDIOGRAM REPORT    Patient Name:   Cameron Carey Date of Exam: 07/21/2024 Medical Rec #:  [id number]      Height:       75.0 in Accession #:    [id number]     Weight:       220.2 lb Date of Birth:  Oct 15, 1955      BSA:          2.286 m Patient Age:    68 years       BP:           140/72 mmHg Patient Gender: M              HR:           61 bpm. Exam Location:  [address]  Procedure: 2D Echo, Cardiac Doppler and Color Doppler (Both Spectral and Color Flow Doppler were utilized during procedure).  Indications:    Z95.2 S/p AVR  History:  Patient has prior history of Echocardiogram examinations, most recent 08/17/2020. CAD, S/p AVR (2018), Arrythmias:Atrial Fibrillation and Bradycardia; Risk Factors:Hypertension and Dyslipidemia. Aortic Valve: valve is present in the aortic position. Procedure Date: 2018.  Sonographer:    Elsie Bohr RDCS Referring Phys: [id number] SHERI HAMMOCK  IMPRESSIONS   1. Left ventricular ejection fraction, by estimation, is 60 to 65%. The left ventricle has normal function. The left ventricle has no regional wall motion abnormalities. Left ventricular diastolic function could not be evaluated. Elevated left atrial pressure. 2. Right ventricular systolic function is normal. The right ventricular size is normal. There is normal pulmonary artery systolic  pressure. 3. Left atrial size was severely dilated. 4. Right atrial size was mildly dilated. 5. The mitral valve is degenerative. No evidence of mitral valve regurgitation. No evidence of mitral stenosis. Moderate mitral annular calcification. 6. The tricuspid valve is degenerative. Tricuspid valve regurgitation is moderate. 7. Moderate to severe central AR, not seen on previous echocardiogram. Gradients mildly elevated as well with abnormal AT and DVI of 0.34 suggesting at least some degree of vavular stenosis. The aortic valve has been repaired/replaced. Aortic valve regurgitation is moderate to severe. Mild to moderate aortic valve stenosis. There is a valve present in the aortic position. Procedure Date: 2018. 8. The inferior vena cava is normal in size with greater than 50% respiratory variability, suggesting right atrial pressure of 3 mmHg.  FINDINGS Left Ventricle: Left ventricular ejection fraction, by estimation, is 60 to 65%. The left ventricle has normal function. The left ventricle has no regional wall motion abnormalities. The left ventricular internal cavity size was normal in size. There is no left ventricular hypertrophy. Left ventricular diastolic function could not be evaluated due to mitral annular calcification (moderate or greater). Left ventricular diastolic function could not be evaluated. Elevated left atrial pressure.  Right Ventricle: The right ventricular size is normal. No increase in right ventricular wall thickness. Right ventricular systolic function is normal. There is normal pulmonary artery systolic pressure. The tricuspid regurgitant velocity is 2.87 m/s, and with an assumed right atrial pressure of 3 mmHg, the estimated right ventricular systolic pressure is 35.9 mmHg.  Left Atrium: Left atrial size was severely dilated.  Right Atrium: Right atrial size was mildly dilated.  Pericardium: There is no evidence of pericardial effusion.  Mitral Valve: The mitral  valve is degenerative in appearance. Moderate mitral annular calcification. No evidence of mitral valve regurgitation. No evidence of mitral valve stenosis.  Tricuspid Valve: The tricuspid valve is degenerative in appearance. Tricuspid valve regurgitation is moderate . No evidence of tricuspid stenosis.  Aortic Valve: Moderate to severe central AR, not seen on previous echocardiogram. Gradients mildly elevated as well with abnormal AT and DVI of 0.34 suggesting at least some degree of vavular stenosis. The aortic valve has been repaired/replaced. Aortic valve regurgitation is moderate to severe. Aortic regurgitation PHT measures 376 msec. Mild to moderate aortic stenosis is present. Aortic valve mean gradient measures 22.3 mmHg. Aortic valve peak gradient measures 40.8 mmHg. Aortic valve area, by VTI measures 1.26 cm. There is a valve present in the aortic position. Procedure Date: 2018.  Pulmonic Valve: The pulmonic valve was normal in structure. Pulmonic valve regurgitation is not visualized. No evidence of pulmonic stenosis.  Aorta: The aortic root and ascending aorta are structurally normal, with no evidence of dilitation.  Venous: The inferior vena cava is normal in size with greater than 50% respiratory variability, suggesting right atrial pressure of 3 mmHg.  IAS/Shunts: No atrial  level shunt detected by color flow Doppler.   LEFT VENTRICLE PLAX 2D LVIDd:         5.00 cm   Diastology LVIDs:         3.10 cm   LV e' medial:    8.70 cm/s LV PW:         1.30 cm   LV E/e' medial:  15.7 LV IVS:        1.60 cm   LV e' lateral:   10.70 cm/s LVOT diam:     2.10 cm   LV E/e' lateral: 12.8 LV SV:         100 LV SV Index:   44 LVOT Area:     3.46 cm   RIGHT VENTRICLE             IVC RV S prime:     13.90 cm/s  IVC diam: 1.70 cm TAPSE (M-mode): 2.4 cm RVSP:           35.9 mmHg   PULMONARY VEINS Diastolic Velocity: 65.00 cm/s S/D Velocity:       0.70 Systolic Velocity:  44.00  cm/s  LEFT ATRIUM              Index        RIGHT ATRIUM           Index LA diam:        4.90 cm  2.14 cm/m   RA Pressure: 3.00 mmHg LA Vol (A2C):   120.0 ml 52.49 ml/m  RA Area:     25.00 cm LA Vol (A4C):   135.0 ml 59.05 ml/m  RA Volume:   83.90 ml  36.70 ml/m LA Biplane Vol: 132.0 ml 57.74 ml/m AORTIC VALVE AV Area (Vmax):    1.17 cm AV Area (Vmean):   1.20 cm AV Area (VTI):     1.26 cm AV Vmax:           319.33 cm/s AV Vmean:          216.000 cm/s AV VTI:            0.795 m AV Peak Grad:      40.8 mmHg AV Mean Grad:      22.3 mmHg LVOT Vmax:         108.00 cm/s LVOT Vmean:        74.600 cm/s LVOT VTI:          0.289 m LVOT/AV VTI ratio: 0.36 AI PHT:            376 msec  AORTA Ao Root diam: 3.50 cm Ao Asc diam:  3.80 cm  MITRAL VALVE                TRICUSPID VALVE MV Area (PHT): 3.12 cm     TR Peak grad:   32.9 mmHg MV Decel Time: 243 msec     TR Vmax:        287.00 cm/s MV E velocity: 137.00 cm/s  Estimated RAP:  3.00 mmHg MV A velocity: 51.40 cm/s   RVSP:           35.9 mmHg MV E/A ratio:  2.67 SHUNTS Systemic VTI:  0.29 m Systemic Diam: 2.10 cm  Morene Brownie Electronically signed by Morene Brownie Signature Date/Time: 07/21/2024/9:18:04 PM    Final   TEE  ECHO TEE 02/01/2021  Narrative TRANSESOPHOGEAL ECHO REPORT    Patient Name:   ANTHONE PRIEUR Date of Exam: 02/01/2021 Medical Rec #:  [id number]      Height:       74.0 in Accession #:    [id number]     Weight:       271.1 lb Date of Birth:  1956/02/19      BSA:          2.474 m Patient Age:    64 years       BP:           93/62 mmHg Patient Gender: M              HR:           84 bpm. Exam Location:  Inpatient  Procedure: Transesophageal Echo, Cardiac Doppler, Color Doppler and 3D Echo  Indications:     Atrial Fibrillation  History:         Patient has prior history of Echocardiogram examinations, most recent 08/17/2020. Arrythmias:Atrial Fibrillation; Risk Factors:Hypertension,  Dyslipidemia and Non-Smoker. Aortic Valve: valve is present in the aortic position. Procedure Date: 06/16/2015.  Sonographer:     Recardo Hedge RDCS Referring Phys:  [id number] POWELL BRAVO [place] Diagnosing Phys: Maude Emmer MD  PROCEDURE: The transesophogeal probe was passed without difficulty through the esophogus of the patient. Sedation performed by different physician. The patient was monitored while under deep sedation. Anesthestetic sedation was provided intravenously by Anesthesiology: 264.44mg  of Propofol . The patient developed no complications during the procedure.  IMPRESSIONS   1. Left ventricular ejection fraction, by estimation, is 55 to 60%. The left ventricle has normal function. Left ventricular diastolic function could not be evaluated. 2. Right ventricular systolic function is normal. The right ventricular size is normal. 3. Left atrial size was moderately dilated. No left atrial/left atrial appendage thrombus was detected. 4. Right atrial size was moderately dilated. 5. The mitral valve is normal in structure. Mild mitral valve regurgitation. 6. Tricuspid valve regurgitation is mild to moderate. 7. Post AVR with stented bioprothetic valve No PVL mean gradient 8 mmHg AVA 2.2 cm2. The aortic valve has been repaired/replaced. Aortic valve regurgitation is not visualized. There is a valve present in the aortic position. Procedure Date: 06/16/2015.  FINDINGS Left Ventricle: Left ventricular ejection fraction, by estimation, is 55 to 60%. The left ventricle has normal function. The left ventricular internal cavity size was normal in size. There is no left ventricular hypertrophy. Left ventricular diastolic function could not be evaluated.  Right Ventricle: The right ventricular size is normal. No increase in right ventricular wall thickness. Right ventricular systolic function is normal.  Left Atrium: Left atrial size was moderately dilated. No left atrial/left atrial appendage  thrombus was detected.  Right Atrium: Right atrial size was moderately dilated.  Pericardium: There is no evidence of pericardial effusion.  Mitral Valve: The mitral valve is normal in structure. Mild mitral valve regurgitation.  Tricuspid Valve: The tricuspid valve is normal in structure. Tricuspid valve regurgitation is mild to moderate.  Aortic Valve: Post AVR with stented bioprothetic valve No PVL mean gradient 8 mmHg AVA 2.2 cm2. The aortic valve has been repaired/replaced. Aortic valve regurgitation is not visualized. Aortic valve mean gradient measures 8.5 mmHg. Aortic valve peak gradient measures 14.4 mmHg. Aortic valve area, by VTI measures 2.22 cm. There is a valve present in the aortic position. Procedure Date: 06/16/2015.  Pulmonic Valve: The pulmonic valve was normal in structure. Pulmonic valve regurgitation is mild.  Aorta: The aortic root is normal in size and structure.  IAS/Shunts: No atrial level shunt detected by color flow Doppler.  LEFT VENTRICLE PLAX 2D LVOT diam:     2.60 cm LV SV:         85 LV SV Index:   34 LVOT Area:     5.31 cm   AORTIC VALVE AV Area (Vmax):    2.33 cm AV Area (Vmean):   2.18 cm AV Area (VTI):     2.22 cm AV Vmax:           189.50 cm/s AV Vmean:          136.000 cm/s AV VTI:            0.382 m AV Peak Grad:      14.4 mmHg AV Mean Grad:      8.5 mmHg LVOT Vmax:         83.00 cm/s LVOT Vmean:        55.900 cm/s LVOT VTI:          0.160 m LVOT/AV VTI ratio: 0.42   SHUNTS Systemic VTI:  0.16 m Systemic Diam: 2.60 cm  Maude Emmer MD Electronically signed by Maude Emmer MD Signature Date/Time: 02/01/2021/11:34:12 AM    Final        ______________________________________________________________________________________________      EKG:        Recent Labs: 04/14/2024: ALT 18; BUN 17; Creatinine, Ser 0.87; Hemoglobin 13.6; Platelets 131.0; Potassium 3.7; Sodium 139  Recent Lipid Panel    Component Value  Date/Time   CHOL 148 04/14/2024 1213   TRIG 94.0 04/14/2024 1213   HDL 53.50 04/14/2024 1213   CHOLHDL 3 04/14/2024 1213   VLDL 18.8 04/14/2024 1213   LDLCALC 76 04/14/2024 1213   LDLDIRECT 118.0 08/20/2021 1533           Physical Exam:    VS:  BP (!) 153/62 (BP Location: Left Arm)   Pulse (!) 54   Ht 6' 3 (1.905 m)   Wt 231 lb (104.8 kg)   SpO2 96%   BMI 28.87 kg/m     Wt Readings from Last 3 Encounters:  07/29/24 231 lb (104.8 kg)  05/19/24 220 lb 4 oz (99.9 kg)  04/14/24 224 lb (101.6 kg)     GEN:  Well nourished, well developed in no acute distress HEENT: Normal NECK: No JVD; No carotid bruits LYMPHATICS: No lymphadenopathy CARDIAC: RRR, 2/6 systolic ejection murmur at the right upper sternal border and 3/6 diastolic decrescendo murmur at the left lower sternal border RESPIRATORY:  Clear to auscultation without rales, wheezing or rhonchi  ABDOMEN: Soft, non-tender, non-distended MUSCULOSKELETAL:  No edema; No deformity  SKIN: Warm and dry NEUROLOGIC:  Alert and oriented x 3 PSYCHIATRIC:  Normal affect   Assessment & Plan Prosthetic valve dysfunction, initial encounter The patient has developed moderately severe prosthetic aortic valve dysfunction, now 10 years out from treatment with 25 mm Sorin bioprosthetic valve.  We discussed the natural history of bioprosthetic valve dysfunction today.  I personally reviewed his echo images which demonstrate leaflet thickening on 2D imaging and the presence of moderate to severe central aortic regurgitation and moderately elevated transvalvular gradients suggestive of at least mild aortic stenosis.  He has minimal symptoms.  We had a lengthy discussion about treatment options, timing of intervention, and his circumstances of planning to move back to his home of [place], [place]  next month.  We discussed that considerations will include redo surgical aortic valve replacement considering his relatively young age and good health.   But also if his anatomy is favorable he might be  a suitable candidate for valve in valve TAVR.  He does have a relatively large surgical prosthesis in place which may give him a favorable risk plane in the future as long as his root is large enough.  After this discussion, the patient prefers to be treated once he settles back into his home in [place] .  I emphasized the importance of seeking treatment within the next 6 months as it is very common to see prosthetic valve dysfunction progressed much more rapidly than native valve dysfunction.  He understands to seek immediate medical attention if he develops severe shortness of breath, chest discomfort, or presyncope/syncope.  I reached out to colleagues and was given a recommendation of Dr. Venetia Starr with [facility] in [place], [place] .  I passed this information on to the patient.            Medication Adjustments/Labs and Tests Ordered: Current medicines are reviewed at length with the patient today.  Concerns regarding medicines are outlined above.  No orders of the defined types were placed in this encounter.  No orders of the defined types were placed in this encounter.   Patient Instructions  Medication Instructions:  No medication changes were made at this visit. Continue current regimen.   *If you need a refill on your cardiac medications before your next appointment, please call your pharmacy*  Lab Work: None ordered today. If you have labs (blood work) drawn today and your tests are completely normal, you will receive your results only by: MyChart Message (if you have MyChart) OR A paper copy in the mail If you have any lab test that is abnormal or we need to change your treatment, we will call you to review the results.  Testing/Procedures: None ordered today.  Follow-Up: At [facility], you and your health needs are our priority.  As part of our continuing mission to provide you with exceptional heart care,  our providers are all part of one team.  This team includes your primary Cardiologist (physician) and Advanced Practice Providers or APPs (Physician Assistants and Nurse Practitioners) who all work together to provide you with the care you need, when you need it.   Signed, Ozell Fell, MD  07/29/2024 6:00 PM    [facility]     [1]  Current Meds  Medication Sig   allopurinol  (ZYLOPRIM ) 100 MG tablet Take 2 tablets (200 mg total) by mouth daily.   amLODipine  (NORVASC ) 5 MG tablet Take 1 tablet (5 mg total) by mouth daily.   aspirin  EC 81 MG tablet Take 1 tablet (81 mg total) by mouth daily. Swallow whole.   atorvastatin  (LIPITOR) 40 MG tablet TAKE 1 TABLET BY MOUTH EVERYDAY AT BEDTIME   hydrochlorothiazide  (HYDRODIURIL ) 12.5 MG tablet Take 1 tablet (12.5 mg total) by mouth daily.   hydrOXYzine  (ATARAX ) 25 MG tablet TAKE 1 TABLET BY MOUTH AT BEDTIME AS NEEDED FOR ITCHING.   metoprolol  succinate (TOPROL -XL) 100 MG 24 hr tablet Take 1.5 tablets (150 mg total) by mouth daily. Take with or immediately following a meal   Multiple Vitamin (MULTIVITAMIN WITH MINERALS) TABS tablet Take 1 tablet by mouth daily.   olmesartan  (BENICAR ) 40 MG tablet Take 1 tablet (40 mg total) by mouth daily.   triamcinolone cream (KENALOG) 0.1 % Apply topically as needed (itching).   zolpidem  (AMBIEN ) 10 MG tablet TAKE 1/2 TO 1 TABLET BY MOUTH AT BEDTIME AS NEEDED FOR SLEEP   "

## 2024-07-29 NOTE — Patient Instructions (Signed)
 Medication Instructions:  No medication changes were made at this visit. Continue current regimen.   *If you need a refill on your cardiac medications before your next appointment, please call your pharmacy*  Lab Work: None ordered today. If you have labs (blood work) drawn today and your tests are completely normal, you will receive your results only by: MyChart Message (if you have MyChart) OR A paper copy in the mail If you have any lab test that is abnormal or we need to change your treatment, we will call you to review the results.  Testing/Procedures: None ordered today.  Follow-Up: At [facility], you and your health needs are our priority.  As part of our continuing mission to provide you with exceptional heart care, our providers are all part of one team.  This team includes your primary Cardiologist (physician) and Advanced Practice Providers or APPs (Physician Assistants and Nurse Practitioners) who all work together to provide you with the care you need, when you need it.

## 2024-07-30 NOTE — Telephone Encounter (Signed)
 Spoke with the front desk staff at [facility] where Dr. Venetia Starr is located and asked if there is a specific process to send referrals and if they were in the Epic system since I have had issues sending a referral through Epic. Staff stated they are but if we were having issues, we could fax the referral. Both a referral and Dr. Margurite last OV note faxed to Dr. Nolan office. Fax transmission labeled as success.

## 2024-07-30 NOTE — Addendum Note (Signed)
 Addended by: GORDON RONAL SQUIBB on: 07/30/2024 04:17 PM   Modules accepted: Orders

## 2024-08-26 ENCOUNTER — Ambulatory Visit

## 2024-09-26 ENCOUNTER — Ambulatory Visit

## 2024-10-27 ENCOUNTER — Ambulatory Visit

## 2024-11-27 ENCOUNTER — Ambulatory Visit

## 2024-12-28 ENCOUNTER — Ambulatory Visit

## 2025-01-28 ENCOUNTER — Ambulatory Visit

## 2025-02-28 ENCOUNTER — Ambulatory Visit

## 2025-03-31 ENCOUNTER — Ambulatory Visit

## 2025-05-01 ENCOUNTER — Ambulatory Visit

## 2025-06-01 ENCOUNTER — Ambulatory Visit

## 2025-07-02 ENCOUNTER — Ambulatory Visit
